# Patient Record
Sex: Female | Born: 1945 | Race: White | Hispanic: No | Marital: Married | Smoking: Never smoker
Health system: Southern US, Community
[De-identification: ages and names within clinical notes are randomized; demographics above are authoritative.]

## PROBLEM LIST (undated history)

## (undated) DIAGNOSIS — R112 Nausea with vomiting, unspecified: Secondary | ICD-10-CM

## (undated) DIAGNOSIS — M810 Age-related osteoporosis without current pathological fracture: Secondary | ICD-10-CM

## (undated) DIAGNOSIS — I10 Essential (primary) hypertension: Secondary | ICD-10-CM

## (undated) DIAGNOSIS — Z9889 Other specified postprocedural states: Secondary | ICD-10-CM

## (undated) DIAGNOSIS — E785 Hyperlipidemia, unspecified: Secondary | ICD-10-CM

## (undated) HISTORY — PX: BREAST SURGERY: SHX581

## (undated) HISTORY — DX: Age-related osteoporosis without current pathological fracture: M81.0

## (undated) HISTORY — DX: Essential (primary) hypertension: I10

## (undated) HISTORY — PX: BREAST CYST ASPIRATION: SHX578

## (undated) HISTORY — DX: Hyperlipidemia, unspecified: E78.5

---

## 2006-06-16 HISTORY — PX: COSMETIC SURGERY: SHX468

## 2006-08-24 ENCOUNTER — Ambulatory Visit (HOSPITAL_COMMUNITY): Admission: RE | Admit: 2006-08-24 | Discharge: 2006-08-24 | Payer: Self-pay | Admitting: Urology

## 2009-11-30 ENCOUNTER — Encounter (INDEPENDENT_AMBULATORY_CARE_PROVIDER_SITE_OTHER): Payer: Self-pay | Admitting: *Deleted

## 2010-01-07 ENCOUNTER — Encounter (INDEPENDENT_AMBULATORY_CARE_PROVIDER_SITE_OTHER): Payer: Self-pay | Admitting: *Deleted

## 2010-01-12 ENCOUNTER — Ambulatory Visit: Payer: Self-pay | Admitting: Gastroenterology

## 2010-05-18 NOTE — Letter (Signed)
Summary: Pre Visit Letter Revised  [facility]  [address] [place], [place] [address]   Phone: [phone]  Fax: [phone]    11/30/2009 MRN: [id number]  Sarah Williams [address] [place], [place]  [address]              Procedure Date:  01-26-10    Welcome to the Gastroenterology Division at [facility].    You are scheduled to see a nurse for your pre-procedure visit on 01-12-10 at 9:00a.m. on the 3rd floor at [facility], [address]  We ask that you try to arrive at our office 15 minutes prior to your appointment time to allow for check-in.  Please take a minute to review the attached form.  If you answer "Yes" to one or more of the questions on the first page, we ask that you call the person listed at your earliest opportunity.  If you answer "No" to all of the questions, please complete the rest of the form and bring it to your appointment.    Your nurse visit will consist of discussing your medical and surgical history, your immediate family medical history, and your medications.    If you are unable to list all of your medications on the form, please bring the medication bottles to your appointment and we will list them.  We will need to be aware of both prescribed and over the counter drugs.  We will need to know exact dosage information as well.    Please be prepared to read and sign documents such as consent forms, a financial agreement, and acknowledgement forms.  If necessary, and with your consent, a friend or relative is welcome to sit-in on the nurse visit with you.  Please bring your insurance card so that we may make a copy of it.  If your insurance requires a referral to see a specialist, please bring your referral form from your primary care physician.  No co-pay is required for this nurse visit.     If you cannot keep your appointment, please call [phone] to cancel or reschedule prior to your appointment date.  This allows [place]  the opportunity to schedule an appointment for another patient in need of care.   Thank you for choosing [facility] for your medical needs.  We appreciate the opportunity to care for you.  Please visit [place] at our website  to learn more about our practice.                     Sincerely,  The Gastroenterology Division

## 2010-05-18 NOTE — Letter (Signed)
Summary: Moviprep Instructions  [facility]  [address]   [place], [place] [address]   Phone: [phone]  Fax: [phone]       Sarah Williams    [birth year]    MRN: [id number]        Procedure Day Dorna Bloom: Wednesday, 01-26-10     Arrival Time: 7:30 a.m.      Procedure Time: 8:30 a.m.     Location of Procedure:                     x  [facility] (4th Floor)                        PREPARATION FOR COLONOSCOPY WITH MOVIPREP   Starting 5 days prior to your procedure 01-21-10  do not eat nuts, seeds, popcorn, corn, beans, peas,  salads, or any raw vegetables.  Do not take any fiber supplements (e.g. Metamucil, Citrucel, and Benefiber).  THE DAY BEFORE YOUR PROCEDURE         DATE: 01-25-10  DAY: Tuesday  1.  Drink clear liquids the entire day-NO SOLID FOOD  2.  Do not drink anything colored red or purple.  Avoid juices with pulp.  No orange juice.  3.  Drink at least 64 oz. (8 glasses) of fluid/clear liquids during the day to prevent dehydration and help the prep work efficiently.  CLEAR LIQUIDS INCLUDE: Water Jello Ice Popsicles Tea (sugar ok, no milk/cream) Powdered fruit flavored drinks Coffee (sugar ok, no milk/cream) Gatorade Juice: apple, white grape, white cranberry  Lemonade Clear bullion, consomm, broth Carbonated beverages (any kind) Strained chicken noodle soup Hard Candy                             4.  In the morning, mix first dose of MoviPrep solution:    Empty 1 Pouch A and 1 Pouch B into the disposable container    Add lukewarm drinking water to the top line of the container. Mix to dissolve    Refrigerate (mixed solution should be used within 24 hrs)  5.  Begin drinking the prep at 5:00 p.m. The MoviPrep container is divided by 4 marks.   Every 15 minutes drink the solution down to the next mark (approximately 8 oz) until the full liter is complete.   6.  Follow completed prep with 16 oz of clear liquid of your  choice (Nothing red or purple).  Continue to drink clear liquids until bedtime.  7.  Before going to bed, mix second dose of MoviPrep solution:    Empty 1 Pouch A and 1 Pouch B into the disposable container    Add lukewarm drinking water to the top line of the container. Mix to dissolve    Refrigerate  THE DAY OF YOUR PROCEDURE      DATE: 01-26-10  DAY: Wednesday  Beginning at  3:30 a.m. (5 hours before procedure):         1. Every 15 minutes, drink the solution down to the next mark (approx 8 oz) until the full liter is complete.  2. Follow completed prep with 16 oz. of clear liquid of your choice.    3. You may drink clear liquids until  6:30 a.m. (2 HOURS BEFORE PROCEDURE).   MEDICATION INSTRUCTIONS  Unless otherwise instructed, you should take regular prescription medications with a small sip of water   as early  as possible the morning of your procedure.           OTHER INSTRUCTIONS  You will need a responsible adult at least 63 years of age to accompany you and drive you home.   This person must remain in the waiting room during your procedure.  Wear loose fitting clothing that is easily removed.  Leave jewelry and other valuables at home.  However, you may wish to bring a book to read or  an iPod/MP3 player to listen to music as you wait for your procedure to start.  Remove all body piercing jewelry and leave at home.  Total time from sign-in until discharge is approximately 2-3 hours.  You should go home directly after your procedure and rest.  You can resume normal activities the  day after your procedure.  The day of your procedure you should not:   Drive   Make legal decisions   Operate machinery   Drink alcohol   Return to work  You will receive specific instructions about eating, activities and medications before you leave.    The above instructions have been reviewed and explained to me by  Karl Bales RN  January 12, 2010 9:12  AM    I fully understand and can verbalize these instructions _____________________________ Date _________

## 2010-05-18 NOTE — Miscellaneous (Signed)
Summary: [facility] previsit  Clinical Lists Changes  Medications: Added new medication of MOVIPREP 100 GM  SOLR (PEG-KCL-NACL-NASULF-NA ASC-C) As per prep instructions. - Signed Rx of MOVIPREP 100 GM  SOLR (PEG-KCL-NACL-NASULF-NA ASC-C) As per prep instructions.;  #1 x 0;  Signed;  Entered by: Karl Bales RN;  Authorized by: Rachael Fee MD;  Method used: Electronically to [facility] [address], [place], [place], [place]  [address], Ph: [phone], Fax: [phone] Observations: Added new observation of NKA: T (01/12/2010 8:51)    Prescriptions: MOVIPREP 100 GM  SOLR (PEG-KCL-NACL-NASULF-NA ASC-C) As per prep instructions.  #1 x 0   Entered by:   Karl Bales RN   Authorized by:   Rachael Fee MD   Signed by:   Karl Bales RN on 01/12/2010   Method used:   Electronically to        [facility]  [address] (retail)       [address]       [place], [place]  [address]       Ph: [phone]       Fax: [phone]   RxID:   [id number]

## 2013-09-12 LAB — HM MAMMOGRAPHY: HM Mammogram: ABNORMAL

## 2013-09-19 ENCOUNTER — Encounter: Payer: Self-pay | Admitting: Family Medicine

## 2013-09-30 LAB — HM MAMMOGRAPHY: HM Mammogram: NEGATIVE

## 2013-10-07 ENCOUNTER — Encounter: Payer: Self-pay | Admitting: Family Medicine

## 2014-02-05 ENCOUNTER — Encounter: Payer: Self-pay | Admitting: Family Medicine

## 2014-02-05 DIAGNOSIS — I10 Essential (primary) hypertension: Secondary | ICD-10-CM | POA: Insufficient documentation

## 2014-02-05 DIAGNOSIS — M810 Age-related osteoporosis without current pathological fracture: Secondary | ICD-10-CM | POA: Insufficient documentation

## 2014-02-05 DIAGNOSIS — E785 Hyperlipidemia, unspecified: Secondary | ICD-10-CM | POA: Insufficient documentation

## 2014-02-18 ENCOUNTER — Encounter: Payer: Self-pay | Admitting: Physician Assistant

## 2014-02-18 ENCOUNTER — Ambulatory Visit (INDEPENDENT_AMBULATORY_CARE_PROVIDER_SITE_OTHER): Payer: Medicare Other | Admitting: Physician Assistant

## 2014-02-18 VITALS — BP 200/108 | HR 68 | Temp 97.5°F | Resp 18 | Ht 63.75 in | Wt 156.0 lb

## 2014-02-18 DIAGNOSIS — Z0000 Encounter for general adult medical examination without abnormal findings: Secondary | ICD-10-CM | POA: Diagnosis not present

## 2014-02-18 DIAGNOSIS — E559 Vitamin D deficiency, unspecified: Secondary | ICD-10-CM | POA: Diagnosis not present

## 2014-02-18 DIAGNOSIS — Z23 Encounter for immunization: Secondary | ICD-10-CM | POA: Diagnosis not present

## 2014-02-18 DIAGNOSIS — E785 Hyperlipidemia, unspecified: Secondary | ICD-10-CM | POA: Diagnosis not present

## 2014-02-18 DIAGNOSIS — I10 Essential (primary) hypertension: Secondary | ICD-10-CM | POA: Diagnosis not present

## 2014-02-18 DIAGNOSIS — M8580 Other specified disorders of bone density and structure, unspecified site: Secondary | ICD-10-CM | POA: Insufficient documentation

## 2014-02-18 LAB — CBC WITH DIFFERENTIAL/PLATELET
BASOS ABS: 0.1 10*3/uL (ref 0.0–0.1)
Basophils Relative: 1 % (ref 0–1)
EOS ABS: 0.1 10*3/uL (ref 0.0–0.7)
EOS PCT: 1 % (ref 0–5)
HEMATOCRIT: 42.7 % (ref 36.0–46.0)
Hemoglobin: 14.9 g/dL (ref 12.0–15.0)
LYMPHS ABS: 1.7 10*3/uL (ref 0.7–4.0)
Lymphocytes Relative: 27 % (ref 12–46)
MCH: 29.2 pg (ref 26.0–34.0)
MCHC: 34.9 g/dL (ref 30.0–36.0)
MCV: 83.7 fL (ref 78.0–100.0)
MONO ABS: 0.5 10*3/uL (ref 0.1–1.0)
Monocytes Relative: 8 % (ref 3–12)
Neutro Abs: 4 10*3/uL (ref 1.7–7.7)
Neutrophils Relative %: 63 % (ref 43–77)
PLATELETS: 229 10*3/uL (ref 150–400)
RBC: 5.1 MIL/uL (ref 3.87–5.11)
RDW: 13.4 % (ref 11.5–15.5)
WBC: 6.4 10*3/uL (ref 4.0–10.5)

## 2014-02-18 LAB — COMPLETE METABOLIC PANEL WITH GFR
ALT: 11 U/L (ref 0–35)
AST: 17 U/L (ref 0–37)
Albumin: 4.1 g/dL (ref 3.5–5.2)
Alkaline Phosphatase: 80 U/L (ref 39–117)
BILIRUBIN TOTAL: 0.4 mg/dL (ref 0.2–1.2)
BUN: 16 mg/dL (ref 6–23)
CALCIUM: 8.9 mg/dL (ref 8.4–10.5)
CHLORIDE: 105 meq/L (ref 96–112)
CO2: 26 meq/L (ref 19–32)
CREATININE: 0.72 mg/dL (ref 0.50–1.10)
GFR, Est Non African American: 88 mL/min
Glucose, Bld: 92 mg/dL (ref 70–99)
Potassium: 4.1 mEq/L (ref 3.5–5.3)
Sodium: 142 mEq/L (ref 135–145)
Total Protein: 6.6 g/dL (ref 6.0–8.3)

## 2014-02-18 LAB — LIPID PANEL
CHOLESTEROL: 202 mg/dL — AB (ref 0–200)
HDL: 68 mg/dL (ref >39)
LDL CALC: 113 mg/dL — AB (ref 0–99)
TRIGLYCERIDES: 103 mg/dL (ref <150)
Total CHOL/HDL Ratio: 3 Ratio
VLDL: 21 mg/dL (ref 0–40)

## 2014-02-18 LAB — TSH: TSH: 1.107 u[IU]/mL (ref 0.350–4.500)

## 2014-02-18 MED ORDER — AMLODIPINE BESYLATE 5 MG PO TABS: 5.0000 mg | ORAL_TABLET | Freq: Every day | ORAL | Status: DC

## 2014-02-18 MED ORDER — BENAZEPRIL HCL 10 MG PO TABS: 10.0000 mg | ORAL_TABLET | Freq: Every day | ORAL | Status: DC

## 2014-02-18 NOTE — Progress Notes (Signed)
Patient ID: Sarah Williams MRN: [id number], DOB: 04/23/1945, 68 y.o. Date of Encounter: 02/18/2014,   Chief Complaint: Physical (CPE)  HPI: 68 y.o. y/o white female  here for CPE and also as a "New Patient".   Actually, she had been a patient here in the past but says that she then lost her insurance and had no insurance for a couple of years and that is why she has not been here recently.  Last office visit here was 12/15/2010. Last complete physical exam here was with me on 11/29/2009.  She says that 2 years ago her husband lost his job and they lost insurance at that time. Therefore she has had no medical care and has not been on any prescription medications during that time. Luckily, she says that she had no illnesses or medical problems during that period of time. Therefore, no new medical information to update compared to what was already in her paper chart from the past.  Unfortunately, because she had no insurance coverage, she has been off of her Norvasc 10 mg Ambien as 20 mg which he been prescribed prior to the insurance loss. Therefore today we are getting her blood pressure readings significantly elevated. Even with this, she still is reporting no angina symptoms and no TIA/CVA type symptoms.   Review of Systems: Consitutional: No fever, chills, fatigue, night sweats, lymphadenopathy. No significant/unexplained weight changes. Eyes: No visual changes, eye redness, or discharge. ENT/Mouth: No ear pain, sore throat, nasal drainage, or sinus pain. Cardiovascular: No chest pressure,heaviness, tightness or squeezing, even with exertion. No increased shortness of breath or dyspnea on exertion.No palpitations, edema, orthopnea, PND. Respiratory: No cough, hemoptysis, SOB, or wheezing. Gastrointestinal: No anorexia, dysphagia, reflux, pain, nausea, vomiting, hematemesis, diarrhea, constipation, BRBPR, or melena. Breast: No mass, nodules, bulging, or retraction. No skin changes or  inflammation. No nipple discharge. No lymphadenopathy. Genitourinary: No dysuria, hematuria, incontinence, vaginal discharge, pruritis, burning, abnormal bleeding, or pain. Musculoskeletal: No decreased ROM, No joint pain or swelling. No significant pain in neck, back, or extremities. Skin: No rash, pruritis, or concerning lesions. Neurological: No headache, dizziness, syncope, seizures, tremors, memory loss, coordination problems, or paresthesias. Psychological: No anxiety, depression, hallucinations, SI/HI. Endocrine: No polydipsia, polyphagia, polyuria, or known diabetes.No increased fatigue. No palpitations/rapid heart rate. No significant/unexplained weight change. All other systems were reviewed and are otherwise negative.  Past Medical History  Diagnosis Date  . Hypertension   . Osteoporosis   . Hyperlipidemia     11/2010     Past Surgical History  Procedure Laterality Date  . Cosmetic surgery  03/08    blepharoplasty  . Breast surgery      Benign--Bilateral Lumpectomies--Benign    Home Meds:  Outpatient Prescriptions Prior to Visit  Medication Sig Dispense Refill  . Cholecalciferol (VITAMIN D) 2000 UNITS tablet Take 2,000 Units by mouth every evening.    . Multiple Vitamin (MULTIVITAMIN) tablet Take 1 tablet by mouth every evening. Centrum Silver     No facility-administered medications prior to visit.    Allergies: No Known Allergies  History   Social History  . Marital Status: Married    Spouse Name: N/A    Number of Children: N/A  . Years of Education: N/A   Occupational History  . Not on file.   Social History Main Topics  . Smoking status: Never Smoker   . Smokeless tobacco: Never Used  . Alcohol Use: 3.0 oz/week    5 Glasses of wine per week  .  Drug Use: No  . Sexual Activity: Yes    Birth Control/ Protection: Post-menopausal   Other Topics Concern  . Not on file   Social History Narrative   Entered 02/2014:   Married.   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   Works about 16 hours a week now.   Says she "has to stay busy"--in summer works in yard, garden.    Always working on a project in the house etc.     Family History  Problem Relation Age of Onset  . Hypertension Father   . Kidney disease Father   . Stroke Father   . Heart disease Mother 50    CAD  . Heart disease Paternal Grandfather 30    CAD, MI    Physical Exam: Blood pressure 200/108, pulse 68, temperature 97.5 F (36.4 C), temperature source Oral, resp. rate 18, height 5' 3.75" (1.619 m), weight 156 lb (70.761 kg)., Body mass index is 27 kg/(m^2). General: Well developed, well nourished, WF. Appears in no acute distress. HEENT: Normocephalic, atraumatic. Conjunctiva pink, sclera non-icteric. Pupils 2 mm constricting to 1 mm, round, regular, and equally reactive to light and accomodation. EOMI. Internal auditory canal clear. TMs with good cone of light and without pathology. Nasal mucosa pink. Nares are without discharge. No sinus tenderness. Oral mucosa pink.  Pharynx without exudate.   Neck: Supple. Trachea midline. No thyromegaly. Full ROM. No lymphadenopathy.No Carotid Bruits. Lungs: Clear to auscultation bilaterally without wheezes, rales, or rhonchi. Breathing is of normal effort and unlabored. Cardiovascular: RRR with S1 S2. No murmurs, rubs, or gallops. Distal pulses 2+ symmetrically. No carotid or abdominal bruits. Breast: Symmetrical. No masses. Nipples without discharge. Abdomen: Soft, non-tender, non-distended with normoactive bowel sounds. No hepatosplenomegaly or masses. No rebound/guarding. No CVA tenderness. No hernias.  Genitourinary:  External genitalia without lesions. Vaginal mucosa pink.No discharge present. Cervix pink and without discharge. No cervical tenderness.Normal uterus size. No adnexal mass or tenderness.  Pap smear  taken. Musculoskeletal: Full range of motion and 5/5 strength throughout. Without swelling, atrophy, tenderness, crepitus, or warmth. Extremities without clubbing, cyanosis, or edema Skin: Warm and moist without erythema, ecchymosis, wounds, or rash. Neuro: A+Ox3. CN II-XII grossly intact. Moves all extremities spontaneously. Full sensation throughout. Normal gait. DTR 2+ throughout upper and lower extremities. Finger to nose intact. Psych:  Responds to questions appropriately with a normal affect.   Assessment/Plan:  68 y.o. y/o female here for CPE 1. Visit for preventive health examination   A. Screening Labs: She IS fasting.  - CBC with Differential - COMPLETE METABOLIC PANEL WITH GFR - Lipid panel - TSH - Vit D  25 hydroxy (rtn osteoporosis monitoring)  B. Pap: - PAP, Thin Prep w/HPV rflx HPV Type 16/18   C. Screening Mammogram: Prior to loss of insurance, she had mammograms performed routinely at [facility]. She says that she did go have another mammogram performed at [facility] in either June or July of this year (2015)-- (once she had insurance again) states that this was negative.  D. DEXA/BMD:  Has had no DEXA in > 2 years.  Scars to with her that we start getting these done at [facility] versus more head so that the results would be in epic and she is agreeable with this. Therefore I have scheduled placed and notes to schedule this at [facility].  - DG Bone Density; Future  E.  Colorectal Cancer Screening: - Ambulatory referral to Gastroenterology  At the complete physical exam with me 11/29/2009--I had documented that we discussed colonoscopy and she at that time stated she had never had one but was agreeable for me to schedule. Today she states that we did schedule her an appointment but then she found out that with the insurance she had at that time that she was going to have to pay $5000 out of pocket. Therefore she never had colonoscopy done.  She now has Medicare/new  insurance coverage. i will place a new order-- she prefers to go to a place towards [place] so I am scheduling this with Dr. Genia Deloark's group. Her for a location is good for her and the results will be in epic.  F. Immunizations:  Influenza: she has not had an influenza vaccine yet this season and is agreeable to receive this today. Tetanus: She received Tdap here 06/18/2007 Pneumococcal: he has had no Pneumonia vaccines so far. His cussed guidelines and she is agreeable to proceed. Will give Prevnar 13 today. 6-12 months later she will receive the Pneumovax 23. Zostavax:  Today I wrote on her AVS--she is to call her insurance and find out their coverage of this vaccine and what her cost would be out of pocket. If she is agreeable to pay this cost, then she will call us and we will send an order to her pharmacy and she will go there to receive the vaccine.  2. Essential hypertension See HPI regarding history of this. In past she was on Norvasc 10 mg and benazepril 20 mg. I will have her titrate back up to these doses. For the amlodipine she is going to take one 5 mg tablet for one week then go up to taking 2 of these daily. For the benazepril she will take one of these for 1 week and go up to taking 2 of these daily.  She will schedule follow-up office visit here in 3 weeks for me to recheck blood pressure and BMET on medications.  - amLODipine (NORVASC) 5 MG tablet; Take 1 tablet (5 mg total) by mouth daily.  Dispense: 30 tablet; Refill: 1 - benazepril (LOTENSIN) 10 MG tablet; Take 1 tablet (10 mg total) by mouth daily.  Dispense: 30 tablet; Refill: 1 - COMPLETE METABOLIC PANEL WITH GFR  3. Hyperlipidemia - COMPLETE METABOLIC PANEL WITH GFR - Lipid panel  4. Osteopenia - DG Bone Density; Future - Vit D  25 hydroxy (rtn osteoporosis monitoring)  5. Vitamin D deficiency I verified with patient and she says she is taking the vitamin D at the dose listed on her medicine list. - Vit D  25  hydroxy (rtn osteoporosis monitoring)   Follow-up office visit in 3 weeks to recheck blood pressure and BMET.   Signed, 96 Beach AvenueMary Beth SidneyDixon, GeorgiaPA, [facility] 02/18/2014 9:02 AM

## 2014-02-19 ENCOUNTER — Other Ambulatory Visit: Payer: Self-pay | Admitting: Physician Assistant

## 2014-02-19 DIAGNOSIS — Z1211 Encounter for screening for malignant neoplasm of colon: Secondary | ICD-10-CM

## 2014-02-19 LAB — VITAMIN D 25 HYDROXY (VIT D DEFICIENCY, FRACTURES): Vit D, 25-Hydroxy: 44 ng/mL (ref 30–89)

## 2014-02-20 LAB — PAP, THIN PREP W/HPV RFLX HPV TYPE 16/18: HPV DNA HIGH RISK: NOT DETECTED

## 2014-02-23 ENCOUNTER — Encounter: Payer: Self-pay | Admitting: Family Medicine

## 2014-03-04 ENCOUNTER — Telehealth: Payer: Self-pay

## 2014-03-04 NOTE — Telephone Encounter (Signed)
PT is calling to set up TCS

## 2014-03-05 ENCOUNTER — Ambulatory Visit (HOSPITAL_COMMUNITY)
Admission: RE | Admit: 2014-03-05 | Discharge: 2014-03-05 | Disposition: A | Discharge status: Home / Self Care | Payer: Medicare Other | Source: Ambulatory Visit | Attending: Physician Assistant | Admitting: Physician Assistant

## 2014-03-05 ENCOUNTER — Other Ambulatory Visit: Payer: Self-pay

## 2014-03-05 DIAGNOSIS — M8580 Other specified disorders of bone density and structure, unspecified site: Secondary | ICD-10-CM | POA: Diagnosis present

## 2014-03-05 DIAGNOSIS — Z0000 Encounter for general adult medical examination without abnormal findings: Secondary | ICD-10-CM

## 2014-03-05 DIAGNOSIS — Z1211 Encounter for screening for malignant neoplasm of colon: Secondary | ICD-10-CM

## 2014-03-05 NOTE — Telephone Encounter (Signed)
Gastroenterology Pre-Procedure Review  Request Date: 03/05/2014 Requesting Physician:Allayne ButcherMary Dixon   PATIENT REVIEW QUESTIONS: The patient responded to the following health history questions as indicated:    Pt does drink a glass of wine most evenings  1. Diabetes Melitis: no 2. Joint replacements in the past 12 months: no 3. Major health problems in the past 3 months: no 4. Has an artificial valve or MVP: no 5. Has a defibrillator: no 6. Has been advised in past to take antibiotics in advance of a procedure like teeth cleaning: no    MEDICATIONS & ALLERGIES:    Patient reports the following regarding taking any blood thinners:   Plavix? no Aspirin? no Coumadin? no  Patient confirms/reports the following medications:  Current Outpatient Prescriptions  Medication Sig Dispense Refill  . amLODipine (NORVASC) 5 MG tablet Take 1 tablet (5 mg total) by mouth daily. 30 tablet 1  . benazepril (LOTENSIN) 10 MG tablet Take 1 tablet (10 mg total) by mouth daily. 30 tablet 1  . Cholecalciferol (VITAMIN D) 2000 UNITS tablet Take 2,000 Units by mouth every evening.    . Multiple Vitamin (MULTIVITAMIN) tablet Take 1 tablet by mouth every evening. Centrum Silver     No current facility-administered medications for this visit.    Patient confirms/reports the following allergies:  No Known Allergies  No orders of the defined types were placed in this encounter.    AUTHORIZATION INFORMATION Primary Insurance:   ID #:  Group #:  Pre-Cert / Auth required:  Pre-Cert / Auth #:   Secondary Insurance:   ID #:   Group #:  Pre-Cert / Auth required:  Pre-Cert / Auth #:   SCHEDULE INFORMATION: Procedure has been scheduled as follows:  Date:  03/16/2014                 Time:  2:15 PM Location: [facility] Short Stay  This Gastroenterology Pre-Precedure Review Form is being routed to the following provider(s): R. Roetta SessionsMichael Rourk, MD

## 2014-03-06 NOTE — Telephone Encounter (Signed)
Ok to schedule.

## 2014-03-09 ENCOUNTER — Telehealth: Payer: Self-pay

## 2014-03-09 MED ORDER — PEG-KCL-NACL-NASULF-NA ASC-C 100 G PO SOLR: 1.0000 | ORAL | Status: DC

## 2014-03-09 NOTE — Telephone Encounter (Signed)
I called [employer] @ [phone] and spoke to [place]atonka[place]ron R who said a PA is not required for a screening colonoscopy.

## 2014-03-09 NOTE — Telephone Encounter (Signed)
Rx sent to the pharmacy and instructions mailed to pt.  

## 2014-03-09 NOTE — Telephone Encounter (Signed)
Pt requested to pick up the instructions and they are at front for her to pick up and she is aware.

## 2014-03-11 ENCOUNTER — Other Ambulatory Visit: Payer: Self-pay

## 2014-03-11 DIAGNOSIS — Z1211 Encounter for screening for malignant neoplasm of colon: Secondary | ICD-10-CM

## 2014-03-11 MED ORDER — SODIUM CHLORIDE 0.9 % IV SOLN: INTRAVENOUS | Status: DC

## 2014-03-16 ENCOUNTER — Ambulatory Visit: Payer: Medicare Other | Admitting: Physician Assistant

## 2014-03-16 ENCOUNTER — Encounter (HOSPITAL_COMMUNITY)
Admission: RE | Disposition: A | Discharge status: Home / Self Care | Payer: Self-pay | Source: Ambulatory Visit | Attending: Internal Medicine

## 2014-03-16 ENCOUNTER — Ambulatory Visit (HOSPITAL_COMMUNITY)
Admission: RE | Admit: 2014-03-16 | Discharge: 2014-03-16 | Disposition: A | Discharge status: Home / Self Care | Payer: Medicare Other | Source: Ambulatory Visit | Attending: Internal Medicine | Admitting: Internal Medicine

## 2014-03-16 ENCOUNTER — Encounter (HOSPITAL_COMMUNITY): Payer: Self-pay

## 2014-03-16 DIAGNOSIS — Z1211 Encounter for screening for malignant neoplasm of colon: Secondary | ICD-10-CM | POA: Diagnosis not present

## 2014-03-16 DIAGNOSIS — K5730 Diverticulosis of large intestine without perforation or abscess without bleeding: Secondary | ICD-10-CM | POA: Diagnosis not present

## 2014-03-16 DIAGNOSIS — I10 Essential (primary) hypertension: Secondary | ICD-10-CM | POA: Diagnosis not present

## 2014-03-16 DIAGNOSIS — E785 Hyperlipidemia, unspecified: Secondary | ICD-10-CM | POA: Insufficient documentation

## 2014-03-16 DIAGNOSIS — Z79899 Other long term (current) drug therapy: Secondary | ICD-10-CM | POA: Insufficient documentation

## 2014-03-16 HISTORY — DX: Other specified postprocedural states: Z98.890

## 2014-03-16 HISTORY — DX: Nausea with vomiting, unspecified: R11.2

## 2014-03-16 HISTORY — PX: COLONOSCOPY: SHX5424

## 2014-03-16 SURGERY — COLONOSCOPY
Anesthesia: Moderate Sedation

## 2014-03-16 MED ORDER — MEPERIDINE HCL 100 MG/ML IJ SOLN
INTRAMUSCULAR | Status: DC | PRN
  Administered 2014-03-16: 50 mg via INTRAVENOUS
  Administered 2014-03-16 (×2): 25 mg via INTRAVENOUS

## 2014-03-16 MED ORDER — MIDAZOLAM HCL 5 MG/5ML IJ SOLN
INTRAMUSCULAR | Status: AC
  Filled 2014-03-16: qty 10

## 2014-03-16 MED ORDER — MIDAZOLAM HCL 5 MG/5ML IJ SOLN
INTRAMUSCULAR | Status: DC | PRN
  Administered 2014-03-16: 2 mg via INTRAVENOUS
  Administered 2014-03-16: 1 mg via INTRAVENOUS
  Administered 2014-03-16: 2 mg via INTRAVENOUS

## 2014-03-16 MED ORDER — SODIUM CHLORIDE 0.9 % IV SOLN
INTRAVENOUS | Status: DC
  Administered 2014-03-16: 14:00:00 via INTRAVENOUS

## 2014-03-16 MED ORDER — ONDANSETRON HCL 4 MG/2ML IJ SOLN
INTRAMUSCULAR | Status: DC | PRN
  Administered 2014-03-16: 4 mg via INTRAVENOUS

## 2014-03-16 MED ORDER — STERILE WATER FOR IRRIGATION IR SOLN
Status: DC | PRN
  Administered 2014-03-16: 15:00:00

## 2014-03-16 MED ORDER — ONDANSETRON HCL 4 MG/2ML IJ SOLN
INTRAMUSCULAR | Status: AC
  Filled 2014-03-16: qty 2

## 2014-03-16 MED ORDER — MEPERIDINE HCL 100 MG/ML IJ SOLN
INTRAMUSCULAR | Status: AC
  Filled 2014-03-16: qty 2

## 2014-03-16 NOTE — H&P (Signed)
@LOGO @   Primary Care Physician:  Karis Juba, PA-C Primary Gastroenterologist:  Dr. Gala Romney  Pre-Procedure History & Physical: HPI:  Sarah Williams is a 68 y.o. female is here for a screening colonoscopy. No prior colonoscopy. No family history of colon cancer. No GI symptoms.  Past Medical History  Diagnosis Date  . Hypertension   . Osteoporosis   . Hyperlipidemia     11/2010  . PONV (postoperative nausea and vomiting)     Past Surgical History  Procedure Laterality Date  . Cosmetic surgery  03/08    blepharoplasty  . Breast surgery      Benign--Bilateral Lumpectomies--Benign    Prior to Admission medications   Medication Sig Start Date End Date Taking? Authorizing Provider  amLODipine (NORVASC) 5 MG tablet Take 1 tablet (5 mg total) by mouth daily. 02/18/14  Yes Mary B Dixon, PA-C  benazepril (LOTENSIN) 10 MG tablet Take 1 tablet (10 mg total) by mouth daily. 02/18/14  Yes Orlena Sheldon, PA-C  Cholecalciferol (VITAMIN D) 2000 UNITS tablet Take 2,000 Units by mouth every evening.   Yes Historical Provider, MD  Multiple Vitamin (MULTIVITAMIN) tablet Take 1 tablet by mouth every evening. Centrum Silver   Yes Historical Provider, MD  peg 3350 powder (MOVIPREP) 100 G SOLR Take 1 kit (200 g total) by mouth as directed. 03/09/14  Yes Daneil Dolin, MD    Allergies as of 03/05/2014  . (No Known Allergies)    Family History  Problem Relation Age of Onset  . Hypertension Father   . Kidney disease Father   . Stroke Father   . Heart disease Mother 73    CAD  . Heart disease Paternal Grandfather 98    CAD, MI    History   Social History  . Marital Status: Married    Spouse Name: N/A    Number of Children: N/A  . Years of Education: N/A   Occupational History  . Not on file.   Social History Main Topics  . Smoking status: Never Smoker   . Smokeless tobacco: Never Used  . Alcohol Use: 3.0 oz/week    5 Glasses of wine per week  . Drug Use: No  . Sexual  Activity: Yes    Birth Control/ Protection: Post-menopausal   Other Topics Concern  . Not on file   Social History Narrative   Entered 02/2014:   Married.   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   Works about 16 hours a week now.   Says she "has to stay busy"--in summer works in yard, garden.    Always working on a project in the house etc.     Review of Systems: See HPI, otherwise negative ROS  Physical Exam: BP 156/75 mmHg  Temp(Src) 97.7 F (36.5 C) (Oral)  Resp 16  Ht 5' 4"  (1.626 m)  Wt 152 lb (68.947 kg)  BMI 26.08 kg/m2  SpO2 98% General:   Alert,  Well-developed, well-nourished, pleasant and cooperative in NAD Head:  Normocephalic and atraumatic. Eyes:  Sclera clear, no icterus.   Conjunctiva pink. Ears:  Normal auditory acuity. Nose:  No deformity, discharge,  or lesions. Mouth:  No deformity or lesions, dentition normal. Neck:  Supple; no masses or thyromegaly. Lungs:  Clear throughout to auscultation.   No wheezes, crackles, or rhonchi. No acute distress. Heart:  Regular rate and rhythm; no murmurs, clicks,  rubs,  or gallops. Abdomen:  Soft, nontender and nondistended. No masses, hepatosplenomegaly or hernias noted. Normal bowel sounds, without guarding, and without rebound.   Msk:  Symmetrical without gross deformities. Normal posture. Pulses:  Normal pulses noted. Extremities:  Without clubbing or edema. Neurologic:  Alert and  oriented x4;  grossly normal neurologically. Skin:  Intact without significant lesions or rashes. Cervical Nodes:  No significant cervical adenopathy. Psych:  Alert and cooperative. Normal mood and affect.  Impression/Plan: Sarah Williams is now here to undergo a screening colonoscopy.  First ever every her screening examination.  Risks, benefits, limitations, imponderables and alternatives regarding colonoscopy have been  reviewed with the patient. Questions have been answered. All parties agreeable.     Notice:  This dictation was prepared with Dragon dictation along with smaller phrase technology. Any transcriptional errors that result from this process are unintentional and may not be corrected upon review.

## 2014-03-16 NOTE — Discharge Instructions (Addendum)
°Colonoscopy °Discharge Instructions ° °Read the instructions outlined below and refer to this sheet in the next few weeks. These discharge instructions provide you with general information on caring for yourself after you leave the hospital. Your doctor may also give you specific instructions. While your treatment has been planned according to the most current medical practices available, unavoidable complications occasionally occur. If you have any problems or questions after discharge, call Dr. Rourk at [phone]. °ACTIVITY °· You may resume your regular activity, but move at a slower pace for the next 24 hours.  °· Take frequent rest periods for the next 24 hours.  °· Walking will help get rid of the air and reduce the bloated feeling in your belly (abdomen).  °· No driving for 24 hours (because of the medicine (anesthesia) used during the test).   °· Do not sign any important legal documents or operate any machinery for 24 hours (because of the anesthesia used during the test).  °NUTRITION °· Drink plenty of fluids.  °· You may resume your normal diet as instructed by your doctor.  °· Begin with a light meal and progress to your normal diet. Heavy or fried foods are harder to digest and may make you feel sick to your stomach (nauseated).  °· Avoid alcoholic beverages for 24 hours or as instructed.  °MEDICATIONS °· You may resume your normal medications unless your doctor tells you otherwise.  °WHAT YOU CAN EXPECT TODAY °· Some feelings of bloating in the abdomen.  °· Passage of more gas than usual.  °· Spotting of blood in your stool or on the toilet paper.  °IF YOU HAD POLYPS REMOVED DURING THE COLONOSCOPY: °· No aspirin products for 7 days or as instructed.  °· No alcohol for 7 days or as instructed.  °· Eat a soft diet for the next 24 hours.  °FINDING OUT THE RESULTS OF YOUR TEST °Not all test results are available during your visit. If your test results are not back during the visit, make an appointment  with your caregiver to find out the results. Do not assume everything is normal if you have not heard from your caregiver or the medical facility. It is important for you to follow up on all of your test results.  °SEEK IMMEDIATE MEDICAL ATTENTION IF: °· You have more than a spotting of blood in your stool.  °· Your belly is swollen (abdominal distention).  °· You are nauseated or vomiting.  °· You have a temperature over 101.  °· You have abdominal pain or discomfort that is severe or gets worse throughout the day.  ° ° °Diverticulosis information provided ° °Repeat screening colonoscopy in 10 years ° °Diverticulosis °Diverticulosis is the condition that develops when small pouches (diverticula) form in the wall of your colon. Your colon, or large intestine, is where water is absorbed and stool is formed. The pouches form when the inside layer of your colon pushes through weak spots in the outer layers of your colon. °CAUSES  °No one knows exactly what causes diverticulosis. °RISK FACTORS °· Being older than 50. Your risk for this condition increases with age. Diverticulosis is rare in people younger than 40 years. By age 80, almost everyone has it. °· Eating a low-fiber diet. °· Being frequently constipated. °· Being overweight. °· Not getting enough exercise. °· Smoking. °· Taking over-the-counter pain medicines, like aspirin and ibuprofen. °SYMPTOMS  °Most people with diverticulosis do not have symptoms. °DIAGNOSIS  °Because diverticulosis often has no symptoms,   health care providers often discover the condition during an exam for other colon problems. In many cases, a health care provider will diagnose diverticulosis while using a flexible scope to examine the colon (colonoscopy). °TREATMENT  °If you have never developed an infection related to diverticulosis, you may not need treatment. If you have had an infection before, treatment may include: °· Eating more fruits, vegetables, and grains. °· Taking a fiber  supplement. °· Taking a live bacteria supplement (probiotic). °· Taking medicine to relax your colon. °HOME CARE INSTRUCTIONS  °· Drink at least 6-8 glasses of water each day to prevent constipation. °· Try not to strain when you have a bowel movement. °· Keep all follow-up appointments. °If you have had an infection before:  °· Increase the fiber in your diet as directed by your health care provider or dietitian. °· Take a dietary fiber supplement if your health care provider approves. °· Only take medicines as directed by your health care provider. °SEEK MEDICAL CARE IF:  °· You have abdominal pain. °· You have bloating. °· You have cramps. °· You have not gone to the bathroom in 3 days. °SEEK IMMEDIATE MEDICAL CARE IF:  °· Your pain gets worse. °· Your bloating becomes very bad. °· You have a fever or chills, and your symptoms suddenly get worse. °· You begin vomiting. °· You have bowel movements that are bloody or black. °MAKE SURE YOU: °· Understand these instructions. °· Will watch your condition. °· Will get help right away if you are not doing well or get worse. °Document Released: 12/30/2003 Document Revised: 04/08/2013 Document Reviewed: 02/26/2013 °ExitCare® Patient Information ©2015 [employer]. This information is not intended to replace advice given to you by your health care provider. Make sure you discuss any questions you have with your health care provider. ° °

## 2014-03-16 NOTE — Op Note (Signed)
[facility] [address] [place], [id number]   COLONOSCOPY PROCEDURE REPORT  PATIENT: Sarah OhCarter, Darla H  MR#: [id number] BIRTHDATE: 1946/03/21 , 68  yrs. old GENDER: female ENDOSCOPIST: R.  Roetta SessionsMichael Rachyl Wuebker, MD FACP [facility] REFERRED AO:ZHYQBY:Mary Dionicio StallBeth Dixon, PA-C PROCEDURE DATE:  03/16/2014 PROCEDURE:   Colonoscopy, screening INDICATIONS:Average risk colorectal cancer screening examination. MEDICATIONS: Versed 5 mg IV and Demerol 100 mg IV in divided doses. Zofran 4 mg IV. ASA CLASS:       Class II  CONSENT: The risks, benefits, alternatives and imponderables including but not limited to bleeding, perforation as well as the possibility of a missed lesion have been reviewed.  The potential for biopsy, lesion removal, etc. have also been discussed. Questions have been answered.  All parties agreeable.  Please see the history and physical in the medical record for more information.  DESCRIPTION OF PROCEDURE:   After the risks benefits and alternatives of the procedure were thoroughly explained, informed consent was obtained.  The digital rectal exam revealed no abnormalities of the rectum.   The EC-[id number] ([id number]  endoscope was introduced through the anus and advanced to the terminal ileum which was intubated for a short distance. No adverse events experienced.   The quality of the prep was     The instrument was then slowly withdrawn as the colon was fully examined.      COLON FINDINGS: Normal-appearing rectum.  Rectal vault small. Unable to retroflex.  Rectal mucosa seen well on?"face.  Scattered left-sided diverticula; the remainder of the colonic mucosa appeared normal.  The distal 5 cm of terminal ileal mucosa also appeared normal.  Retroflexion was not performed. .  Withdrawal time=9 minutes 0 seconds.  The scope was withdrawn and the procedure completed. COMPLICATIONS: There were no immediate complications.  ENDOSCOPIC IMPRESSION: Colonic  diverticulosis.  RECOMMENDATIONS: Repeat screening colonoscopy in 10 years  eSigned:  R. Roetta SessionsMichael Jaycey Gens, MD Jerrel IvoryFACP [facility] 03/16/2014 3:24 PM   cc:  CPT CODES: ICD CODES:  The ICD and CPT codes recommended by this software are interpretations from the data that the clinical staff has captured with the software.  The verification of the translation of this report to the ICD and CPT codes and modifiers is the sole responsibility of the health care institution and practicing physician where this report was generated.  [employer] will not be held responsible for the validity of the ICD and CPT codes included on this report.  AMA assumes no liability for data contained or not contained herein. CPT is a Publishing rights managerregistered trademark of the [employer].

## 2014-03-17 ENCOUNTER — Encounter (HOSPITAL_COMMUNITY): Payer: Self-pay | Admitting: Internal Medicine

## 2014-03-19 ENCOUNTER — Encounter: Payer: Self-pay | Admitting: Physician Assistant

## 2014-03-19 ENCOUNTER — Ambulatory Visit (INDEPENDENT_AMBULATORY_CARE_PROVIDER_SITE_OTHER): Payer: Medicare Other | Admitting: Physician Assistant

## 2014-03-19 VITALS — BP 158/86 | HR 68 | Temp 98.3°F | Resp 18 | Wt 156.0 lb

## 2014-03-19 DIAGNOSIS — I10 Essential (primary) hypertension: Secondary | ICD-10-CM

## 2014-03-19 LAB — BASIC METABOLIC PANEL WITH GFR
BUN: 14 mg/dL (ref 6–23)
CO2: 29 mEq/L (ref 19–32)
CREATININE: 0.62 mg/dL (ref 0.50–1.10)
Calcium: 9.7 mg/dL (ref 8.4–10.5)
Chloride: 104 mEq/L (ref 96–112)
GFR, Est African American: >89 mL/min
Glucose, Bld: 100 mg/dL — ABNORMAL HIGH (ref 70–99)
POTASSIUM: 4.4 meq/L (ref 3.5–5.3)
Sodium: 142 mEq/L (ref 135–145)

## 2014-03-19 MED ORDER — BENAZEPRIL HCL 20 MG PO TABS: 20.0000 mg | ORAL_TABLET | Freq: Every day | ORAL | Status: DC

## 2014-03-19 MED ORDER — AMLODIPINE BESYLATE 10 MG PO TABS: 10.0000 mg | ORAL_TABLET | Freq: Every day | ORAL | Status: DC

## 2014-03-19 NOTE — Progress Notes (Signed)
Patient ID: Sarah Williams MRN: [id number], DOB: 06/08/1945, 68 y.o. Date of Encounter: 03/19/2014,   Chief Complaint: F/U HTN  HPI: 68 y.o. y/o white female here to f/u HTN.    She recently had office visit with me on 02/18/2014 for CPE and also as a "New Patient".  The following is copied from that [place] Note 02/18/2014:  Actually, she had been a patient here in the past but says that she then lost her insurance and had no insurance for a couple of years and that is why she has not been here recently.  Last office visit here was 12/15/2010. Last complete physical exam here was with me on 11/29/2009.  She says that 2 years ago her husband lost his job and they lost insurance at that time. Therefore she has had no medical care and has not been on any prescription medications during that time. Luckily, she says that she had no illnesses or medical problems during that period of time. Therefore, no new medical information to update compared to what was already in her paper chart from the past.  Unfortunately, because she had no insurance coverage, she has been off of her Norvasc 10 mg and benazepril 35m,  which he been prescribed prior to the insurance loss. Therefore today we are getting her blood pressure readings significantly elevated. Even with this, she still is reporting no angina symptoms and no TIA/CVA type symptoms and no chest pain or SOB.  At that visit on 02/18/14 I prescribed Norvasc 5 mg--told her to take 1 daily for 1 week then increase to 2 daily.                                      --Also prescribed benazepril 10 mg.--Told her to take 1 daily for 1 week then increase to 2 daily.   Today--03/19/2014--- she says that she has been taking these medications as directed and as listed above. She says that she bought a new blood pressure cuff about 6 months ago that was expensive so she thinks that should be accurate. Says that she has been checking her blood pressure 2 or 3  times per day. Says she is getting very different readings at different times but on average is around 128/130 over 70s. She has noticed no adverse effects from the medications.   Review of Systems: Consitutional: No fever, chills, fatigue, night sweats, lymphadenopathy. No significant/unexplained weight changes. Eyes: No visual changes, eye redness, or discharge. ENT/Mouth: No ear pain, sore throat, nasal drainage, or sinus pain. Cardiovascular: No chest pressure,heaviness, tightness or squeezing, even with exertion. No increased shortness of breath or dyspnea on exertion.No palpitations, edema, orthopnea, PND. Respiratory: No cough, hemoptysis, SOB, or wheezing. Gastrointestinal: No anorexia, dysphagia, reflux, pain, nausea, vomiting, hematemesis, diarrhea, constipation, BRBPR, or melena. Breast: No mass, nodules, bulging, or retraction. No skin changes or inflammation. No nipple discharge. No lymphadenopathy. Genitourinary: No dysuria, hematuria, incontinence, vaginal discharge, pruritis, burning, abnormal bleeding, or pain. Musculoskeletal: No decreased ROM, No joint pain or swelling. No significant pain in neck, back, or extremities. Skin: No rash, pruritis, or concerning lesions. Neurological: No headache, dizziness, syncope, seizures, tremors, memory loss, coordination problems, or paresthesias. Psychological: No anxiety, depression, hallucinations, SI/HI. Endocrine: No polydipsia, polyphagia, polyuria, or known diabetes.No increased fatigue. No palpitations/rapid heart rate. No significant/unexplained weight change. All other systems were reviewed and are otherwise negative.  Past Medical  History  Diagnosis Date  . Hypertension   . Osteoporosis   . Hyperlipidemia     11/2010  . PONV (postoperative nausea and vomiting)      Past Surgical History  Procedure Laterality Date  . Cosmetic surgery  03/08    blepharoplasty  . Breast surgery      Benign--Bilateral  Lumpectomies--Benign  . Colonoscopy N/A 03/16/2014    Procedure: COLONOSCOPY;  Surgeon: Daneil Dolin, MD;  Location: [facility];  Service: Endoscopy;  Laterality: N/A;  2:15 PM    Home Meds:  Outpatient Prescriptions Prior to Visit  Medication Sig Dispense Refill  . amLODipine (NORVASC) 5 MG tablet Take 1 tablet (5 mg total) by mouth daily. 30 tablet 1  . benazepril (LOTENSIN) 10 MG tablet Take 1 tablet (10 mg total) by mouth daily. 30 tablet 1  . Cholecalciferol (VITAMIN D) 2000 UNITS tablet Take 2,000 Units by mouth every evening.    . Multiple Vitamin (MULTIVITAMIN) tablet Take 1 tablet by mouth every evening. Centrum Silver    . peg 3350 powder (MOVIPREP) 100 G SOLR Take 1 kit (200 g total) by mouth as directed. 1 kit 0   No facility-administered medications prior to visit.    Allergies: No Known Allergies  History   Social History  . Marital Status: Married    Spouse Name: N/A    Number of Children: N/A  . Years of Education: N/A   Occupational History  . Not on file.   Social History Main Topics  . Smoking status: Never Smoker   . Smokeless tobacco: Never Used  . Alcohol Use: 3.0 oz/week    5 Glasses of wine per week  . Drug Use: No  . Sexual Activity: Yes    Birth Control/ Protection: Post-menopausal   Other Topics Concern  . Not on file   Social History Narrative   Entered 02/2014:   Married.   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   Works about 16 hours a week now.   Says she "has to stay busy"--in summer works in yard, garden.    Always working on a project in the house etc.     Family History  Problem Relation Age of Onset  . Hypertension Father   . Kidney disease Father   . Stroke Father   . Heart disease Mother 45    CAD  . Heart disease Paternal Grandfather 41    CAD, MI    Physical Exam: Blood pressure 158/86, pulse 68, temperature  98.3 F (36.8 C), temperature source Oral, resp. rate 18, weight 156 lb (70.761 kg)., Body mass index is 26.76 kg/(m^2). General: Well developed, well nourished, WF. Appears in no acute distress.  Neck: Supple. Trachea midline. No thyromegaly. Full ROM. No lymphadenopathy.No Carotid Bruits. Lungs: Clear to auscultation bilaterally without wheezes, rales, or rhonchi. Breathing is of normal effort and unlabored. Cardiovascular: RRR with S1 S2. No murmurs, rubs, or gallops.  No carotid bruits.No LE Edema.  Musculoskeletal: Full range of motion and 5/5 strength throughout. Skin: Warm and moist.  No LE Edema. Neuro: A+Ox3. CN II-XII grossly intact. Moves all extremities spontaneously. Full sensation throughout. Normal gait. Psych:  Responds to questions appropriately with a normal affect.   Assessment/Plan:  68 y.o. y/o female here for   1. Essential hypertension  Sh mentioned that she went for colonoscopy this past Monday.  I then  saw that all of her blood pressure readings are documented] easily see them today from that when she was there for the procedure.  Noted that during just the 1-1/2 hours that she was there for the procedure her blood pressure did vary quite a bit and was quite labile.  Readings during that time were as follows: 145/77,   156/76,   126/69,   138/104,    123/67,   118/70,    99/52,   158/86 As well she is getting some labile readings at home but on average it is very good.  We'll continue current dose of medications for now. She will continue to monitor at home realizing that they are labile that time will follow-up with me if she is getting consistently high or consistently low readings. - BASIC METABOLIC PANEL WITH GFR - benazepril (LOTENSIN) 20 MG tablet; Take 1 tablet (20 mg total) by mouth daily.  Dispense: 90 tablet; Refill: 3 - amLODipine (NORVASC) 10 MG tablet; Take 1 tablet (10 mg total) by mouth daily.  Dispense: 90 tablet; Refill: 3   We'll plan for her to  have routine follow-up office visit in 6 months. Follow-up sooner if she is having consistently high or consistently low blood pressure readings or she has any other medical problems arise in the meantime.   THE FOLLOWING IS COPIED FROM HER CPE ON 02/18/2014:  CPE 1. Visit for preventive health examination   A. Screening Labs: She IS fasting.  - CBC with Differential - COMPLETE METABOLIC PANEL WITH GFR - Lipid panel - TSH - Vit D  25 hydroxy (rtn osteoporosis monitoring)  B. Pap: - PAP, Thin Prep w/HPV rflx HPV Type 16/18   C. Screening Mammogram: Prior to loss of insurance, she had mammograms performed routinely at [facility]. She says that she did go have another mammogram performed at [facility] in either June or July of this year (2015)-- (once she had insurance again) states that this was negative.  D. DEXA/BMD:  Has had no DEXA in > 2 years.  Scars to with her that we start getting these done at [facility] versus more head so that the results would be in epic and she is agreeable with this. Therefore I have scheduled placed and notes to schedule this at [place]; Future  E. Colorectal Cancer Screening: - Ambulatory referral to Gastroenterology  At the complete physical exam with me 11/29/2009--I had documented that we discussed colonoscopy and she at that time stated she had never had one but was agreeable for me to schedule. Today she states that we did schedule her an appointment but then she found out that with the insurance she had at that time that she was going to have to pay $5000 out of pocket. Therefore she never had colonoscopy done.  She now has Medicare/new insurance coverage. i will place a new order-- she prefers to go to a place towards  so I am scheduling this with Dr. Guerry Minors group. Her for a location is good for her and the results will be in epic.  F. Immunizations:  Influenza: she has not had an influenza vaccine yet this  season and is agreeable to receive this today. Tetanus: She received Tdap here 06/18/2007 Pneumococcal: he has had no Pneumonia vaccines so far. His cussed guidelines and she is agreeable to proceed. Will give Prevnar 13 today. 6-12 months later she will receive the Pneumovax 23. Zostavax:  Today I wrote on her AVS--she is  to call her insurance and find out their coverage of this vaccine and what her cost would be out of pocket. If she is agreeable to pay this cost, then she will call us and we will send an order to her pharmacy and she will go there to receive the vaccine.  2. Essential hypertension See HPI regarding history of this. In past she was on Norvasc 10 mg and benazepril 20 mg. I will have her titrate back up to these doses. For the amlodipine she is going to take one 5 mg tablet for one week then go up to taking 2 of these daily. For the benazepril she will take one of these for 1 week and go up to taking 2 of these daily.  She will schedule follow-up office visit here in 3 weeks for me to recheck blood pressure and BMET on medications.  - amLODipine (NORVASC) 5 MG tablet; Take 1 tablet (5 mg total) by mouth daily.  Dispense: 30 tablet; Refill: 1 - benazepril (LOTENSIN) 10 MG tablet; Take 1 tablet (10 mg total) by mouth daily.  Dispense: 30 tablet; Refill: 1 - COMPLETE METABOLIC PANEL WITH GFR  3. Hyperlipidemia - COMPLETE METABOLIC PANEL WITH GFR - Lipid panel  4. Osteopenia - DG Bone Density; Future - Vit D  25 hydroxy (rtn osteoporosis monitoring)  5. Vitamin D deficiency I verified with patient and she says she is taking the vitamin D at the dose listed on her medicine list. - Vit D  25 hydroxy (rtn osteoporosis monitoring)   Follow-up office visit in 3 weeks to recheck blood pressure and BMET.   Signed, [address] [place], [place], [facility] 03/19/2014 9:15 AM

## 2014-03-27 ENCOUNTER — Encounter: Payer: Self-pay | Admitting: *Deleted

## 2014-04-23 ENCOUNTER — Encounter: Payer: Self-pay | Admitting: Physician Assistant

## 2014-09-24 ENCOUNTER — Ambulatory Visit: Payer: Medicare Other | Admitting: Physician Assistant

## 2014-10-07 ENCOUNTER — Telehealth: Payer: Self-pay | Admitting: Family Medicine

## 2014-10-07 ENCOUNTER — Encounter: Payer: Self-pay | Admitting: Physician Assistant

## 2014-10-07 ENCOUNTER — Ambulatory Visit (INDEPENDENT_AMBULATORY_CARE_PROVIDER_SITE_OTHER): Payer: Medicare Other | Admitting: Physician Assistant

## 2014-10-07 VITALS — BP 120/80 | HR 78 | Temp 97.9°F | Resp 20 | Ht 64.0 in | Wt 157.0 lb

## 2014-10-07 DIAGNOSIS — I10 Essential (primary) hypertension: Secondary | ICD-10-CM | POA: Diagnosis not present

## 2014-10-07 DIAGNOSIS — Z23 Encounter for immunization: Secondary | ICD-10-CM | POA: Diagnosis not present

## 2014-10-07 DIAGNOSIS — E559 Vitamin D deficiency, unspecified: Secondary | ICD-10-CM

## 2014-10-07 DIAGNOSIS — M8580 Other specified disorders of bone density and structure, unspecified site: Secondary | ICD-10-CM

## 2014-10-07 LAB — BASIC METABOLIC PANEL WITH GFR
BUN: 18 mg/dL (ref 6–23)
CALCIUM: 9.3 mg/dL (ref 8.4–10.5)
CO2: 27 mEq/L (ref 19–32)
CREATININE: 0.69 mg/dL (ref 0.50–1.10)
Chloride: 104 mEq/L (ref 96–112)
GFR, Est African American: >89 mL/min
Glucose, Bld: 105 mg/dL — ABNORMAL HIGH (ref 70–99)
Potassium: 4.1 mEq/L (ref 3.5–5.3)
SODIUM: 142 meq/L (ref 135–145)

## 2014-10-07 NOTE — Addendum Note (Signed)
Addended by: Elvina Mattes T on: 10/07/2014 08:49 AM   Modules accepted: Orders

## 2014-10-07 NOTE — Progress Notes (Addendum)
Patient ID: Sarah Williams MRN: [id number], DOB: February 20, 1946, 69 y.o. Date of Encounter: 10/07/2014,   Chief Complaint: F/U HTN  HPI: 69 y.o. y/o white female here to f/u HTN.     "Homework" WRITTEN ON AVS AT OV 10/07/2014: sCHEULE mammogram and call regarding Zostavax    She recently had office visit with me on 02/18/2014 for CPE and also as a "New Patient".  The following is copied from that OV Note 02/18/2014:  Actually, she had been a patient here in the past but says that she then lost her insurance and had no insurance for a couple of years and that is why she has not been here recently.  Last office visit here was 12/15/2010. Last complete physical exam here was with me on 11/29/2009.  She says that 2 years ago her husband lost his job and they lost insurance at that time. Therefore she has had no medical care and has not been on any prescription medications during that time. Luckily, she says that she had no illnesses or medical problems during that period of time. Therefore, no new medical information to update compared to what was already in her paper chart from the past.  Unfortunately, because she had no insurance coverage, she has been off of her Norvasc 10 mg and benazepril 20mg ,  which he been prescribed prior to the insurance loss. Therefore today we are getting her blood pressure readings significantly elevated. Even with this, she still is reporting no angina symptoms and no TIA/CVA type symptoms and no chest pain or SOB.  At that visit on 02/18/14 I prescribed Norvasc 5 mg--told her to take 1 daily for 1 week then increase to 2 daily.                                      --Also prescribed benazepril 10 mg.--Told her to take 1 daily for 1 week then increase to 2 daily.   OV ----03/19/2014--- she says that she has been taking these medications as directed and as listed above. She says that she bought a new blood pressure cuff about 6 months ago that was expensive so  she thinks that should be accurate. Says that she has been checking her blood pressure 2 or 3 times per day. Says she is getting very different readings at different times but on average is around 128/130 over 70s. She has noticed no adverse effects from the medications.   OV 10/07/2014: She has no complaints today. She continues to take both blood pressure medications as directed with no adverse effects. Checks her blood pressure occasionally at home and is good getting good readings.   Review of Systems: Consitutional: No fever, chills, fatigue, night sweats, lymphadenopathy. No significant/unexplained weight changes. Eyes: No visual changes, eye redness, or discharge. ENT/Mouth: No ear pain, sore throat, nasal drainage, or sinus pain. Cardiovascular: No chest pressure,heaviness, tightness or squeezing, even with exertion. No increased shortness of breath or dyspnea on exertion.No palpitations, edema, orthopnea, PND. Respiratory: No cough, hemoptysis, SOB, or wheezing. Gastrointestinal: No anorexia, dysphagia, reflux, pain, nausea, vomiting, hematemesis, diarrhea, constipation, BRBPR, or melena. Breast: No mass, nodules, bulging, or retraction. No skin changes or inflammation. No nipple discharge. No lymphadenopathy. Genitourinary: No dysuria, hematuria, incontinence, vaginal discharge, pruritis, burning, abnormal bleeding, or pain. Musculoskeletal: No decreased ROM, No joint pain or swelling. No significant pain in neck, back, or extremities. Skin: No  rash, pruritis, or concerning lesions. Neurological: No headache, dizziness, syncope, seizures, tremors, memory loss, coordination problems, or paresthesias. Psychological: No anxiety, depression, hallucinations, SI/HI. Endocrine: No polydipsia, polyphagia, polyuria, or known diabetes.No increased fatigue. No palpitations/rapid heart rate. No significant/unexplained weight change. All other systems were reviewed and are otherwise  negative.  Past Medical History  Diagnosis Date  . Hypertension   . Osteoporosis   . Hyperlipidemia     11/2010  . PONV (postoperative nausea and vomiting)      Past Surgical History  Procedure Laterality Date  . Cosmetic surgery  03/08    blepharoplasty  . Breast surgery      Benign--Bilateral Lumpectomies--Benign  . Colonoscopy N/A 03/16/2014    Procedure: COLONOSCOPY;  Surgeon: Corbin Ade, MD;  [facility];  Service: Endoscopy;  Laterality: N/A;  2:15 PM    Home Meds:  Outpatient Prescriptions Prior to Visit  Medication Sig Dispense Refill  . amLODipine (NORVASC) 10 MG tablet Take 1 tablet (10 mg total) by mouth daily. 90 tablet 3  . benazepril (LOTENSIN) 20 MG tablet Take 1 tablet (20 mg total) by mouth daily. 90 tablet 3  . Cholecalciferol (VITAMIN D) 2000 UNITS tablet Take 2,000 Units by mouth every evening.    . Multiple Vitamin (MULTIVITAMIN) tablet Take 1 tablet by mouth every evening. Centrum Silver     No facility-administered medications prior to visit.    Allergies: No Known Allergies  History   Social History  . Marital Status: Married    Spouse Name: N/A  . Number of Children: N/A  . Years of Education: N/A   Occupational History  . Not on file.   Social History Main Topics  . Smoking status: Never Smoker   . Smokeless tobacco: Never Used  . Alcohol Use: 3.0 oz/week    5 Glasses of wine per week  . Drug Use: No  . Sexual Activity: Yes    Birth Control/ Protection: Post-menopausal   Other Topics Concern  . Not on file   Social History Narrative   Entered 02/2014:   Married.   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   Works about 16 hours a week now.   Says she "has to stay busy"--in summer works in yard, garden.    Always working on a project in the house etc.     Family History  Problem Relation Age of Onset  .  Hypertension Father   . Kidney disease Father   . Stroke Father   . Heart disease Mother 101    CAD  . Heart disease Paternal Grandfather 59    CAD, MI    Physical Exam: Blood pressure 120/80, pulse 78, temperature 97.9 F (36.6 C), temperature source Oral, resp. rate 20, height  (1.626 m), weight 157 lb (71.215 kg)., Body mass index is 26.94 kg/(m^2). General: Well developed, well nourished, WF. Appears in no acute distress.  Neck: Supple. Trachea midline. No thyromegaly. Full ROM. No lymphadenopathy.No Carotid Bruits. Lungs: Clear to auscultation bilaterally without wheezes, rales, or rhonchi. Breathing is of normal effort and unlabored. Cardiovascular: RRR with S1 S2. No murmurs, rubs, or gallops.  No carotid bruits.No LE Edema.  Musculoskeletal: Full range of motion and 5/5 strength throughout. Skin: Warm and moist.  No LE Edema. Neuro: A+Ox3. CN II-XII grossly intact. Moves all extremities spontaneously. Full sensation throughout. Normal gait. Psych:  Responds  to questions appropriately with a normal affect.   Assessment/Plan:  69 y.o. y/o female here for    THE FOLLOWING IS COPIED FROM HER CPE ON 02/18/2014:  CPE 1. Visit for preventive health examination   A. Screening Labs:   --------------------------------------------------  ALL OF THESE RESULTS WERE NORMAL------------------- She IS fasting.  - CBC with Differential - COMPLETE METABOLIC PANEL WITH GFR - Lipid panel - TSH - Vit D  25 hydroxy (rtn osteoporosis monitoring)  B. Pap: - PAP, Thin Prep w/HPV rflx HPV Type 16/18-------------------------------NORMAL----------------------------------   C. Screening Mammogram: Prior to loss of insurance, she had mammograms performed routinely at [facility]. She says that she did go have another mammogram performed at [facility] in either June or July of this year (2015)-- (once she had insurance again) states that this was negative. At OV 10/07/14 she states that she just  got a letter that she is due to schedule follow-up mammogram and she plans to go home and do this. 10/28/2014: Addendum added: Received Mammogram report from [facility]. Mammogram performed 10/23/2014. Negative.   D. DEXA/BMD:  At CPE 11/15 I scheduled her for DEXA scan follow-up. She had DEXA scan 03/05/14. --- Lumbar spine was normal at -0.2. Femur was -2.4 consistent with osteopenia. I compared these findings to her prior DEXA scan which had been 01/05/2010. At that time her worst site was her femur at -2.3. At that time I had reviewed her risk factors for osteoporosis and she only had one positive is being Caucasian. Were negative for smoking, frail, steroid-induced. Also negative for fracture. At that time had recommended calcium vitamin D and weightbearing exercise. Repeat 2 years.  Again at the time of her current DEXA 03/05/14 told her to continue calcium vitamin D and weightbearing exercise and repeat 2 years. Vitamin D level was within normal limits on 02/18/14 on current dose of vitamin D.  E. Colorectal Cancer Screening: She underwent colonoscopy with Dr. work 03/16/2014. Diverticulosis otherwise normal. Repeat 10 years.  F. Immunizations:  Influenza:   Received this here 02/2014 Tetanus: She received Tdap here 06/18/2007 Pneumococcal:  Prevnar 13 --Given here 02/2014.  Pneumovax 23--Given here 10/07/2014 Zostavax:  Today I wrote on her AVS--she is to call her insurance and find out their coverage of this vaccine and what her cost would be out of pocket. If she is agreeable to pay this cost, then she will call us and we will send an order to her pharmacy and she will go there to receive the vaccine.  2. Essential hypertension Blood pressure controlled and at goal. Continue current medications. Check lab to monitor.  3. H/O Hyperlipidemia Lipids were good 02/18/14. LDL 113. Does not require treatment.   4. Osteopenia See note above under Preventive Care  5. Vitamin D deficiency I  verified with patient and she says she is taking the vitamin D at the dose listed on her medicine list. - Vit D of all normal 02/18/2014 on current dose vitamin D.   Follow-up office visit in 6 months or sooner if needed.  "Homework" WRITTEN ON AVS AT OV 10/07/2014: sCHEULE mammogram and call regarding Zostavax   Signed, [address] [place], [place], [facility] 10/07/2014 8:11 AM

## 2014-10-07 NOTE — Telephone Encounter (Signed)
Pt request we call her pharmacy and OK administration of Zostavax.  Order called to pharmacy per provider approval.

## 2014-10-09 ENCOUNTER — Encounter: Payer: Self-pay | Admitting: Family Medicine

## 2014-10-23 LAB — HM MAMMOGRAPHY: HM MAMMO: NORMAL

## 2014-10-28 ENCOUNTER — Encounter: Payer: Self-pay | Admitting: Family Medicine

## 2015-04-08 ENCOUNTER — Encounter: Payer: Self-pay | Admitting: Physician Assistant

## 2015-04-08 ENCOUNTER — Ambulatory Visit (INDEPENDENT_AMBULATORY_CARE_PROVIDER_SITE_OTHER): Payer: Medicare Other | Admitting: Physician Assistant

## 2015-04-08 VITALS — BP 138/80 | HR 68 | Temp 97.6°F | Resp 18 | Ht 64.0 in | Wt 152.0 lb

## 2015-04-08 DIAGNOSIS — Z23 Encounter for immunization: Secondary | ICD-10-CM

## 2015-04-08 DIAGNOSIS — E559 Vitamin D deficiency, unspecified: Secondary | ICD-10-CM | POA: Diagnosis not present

## 2015-04-08 DIAGNOSIS — I10 Essential (primary) hypertension: Secondary | ICD-10-CM | POA: Diagnosis not present

## 2015-04-08 DIAGNOSIS — M8580 Other specified disorders of bone density and structure, unspecified site: Secondary | ICD-10-CM | POA: Diagnosis not present

## 2015-04-08 LAB — BASIC METABOLIC PANEL WITH GFR
BUN: 19 mg/dL (ref 7–25)
CALCIUM: 9.5 mg/dL (ref 8.6–10.4)
CO2: 26 mmol/L (ref 20–31)
Chloride: 104 mmol/L (ref 98–110)
Creat: 0.7 mg/dL (ref 0.50–0.99)
GFR, Est African American: >89 mL/min (ref >=60)
GFR, Est Non African American: >89 mL/min (ref >=60)
GLUCOSE: 89 mg/dL (ref 70–99)
Potassium: 4 mmol/L (ref 3.5–5.3)
Sodium: 143 mmol/L (ref 135–146)

## 2015-04-08 MED ORDER — BENAZEPRIL HCL 20 MG PO TABS: 20.0000 mg | ORAL_TABLET | Freq: Every day | ORAL | Status: DC

## 2015-04-08 MED ORDER — AMLODIPINE BESYLATE 10 MG PO TABS: 10.0000 mg | ORAL_TABLET | Freq: Every day | ORAL | Status: DC

## 2015-04-08 NOTE — Progress Notes (Signed)
Patient ID: Sarah Williams MRN: [id number], DOB: 05-04-1945, 69 y.o. Date of Encounter: 04/08/2015,   Chief Complaint: F/U HTN  HPI: 69 y.o. y/o white female here to f/u HTN.    She had office visit with me on 02/18/2014 for CPE and also as a "New Patient".  The following is copied from that OV Note 02/18/2014:  Actually, she had been a patient here in the past but says that she then lost her insurance and had no insurance for a couple of years and that is why she has not been here recently.  Last office visit here was 12/15/2010. Last complete physical exam here was with me on 11/29/2009.  She says that 2 years ago her husband lost his job and they lost insurance at that time. Therefore she has had no medical care and has not been on any prescription medications during that time. Luckily, she says that she had no illnesses or medical problems during that period of time. Therefore, no new medical information to update compared to what was already in her paper chart from the past.  Unfortunately, because she had no insurance coverage, she has been off of her Norvasc 10 mg and benazepril ,  which he been prescribed prior to the insurance loss. Therefore today we are getting her blood pressure readings significantly elevated. Even with this, she still is reporting no angina symptoms and no TIA/CVA type symptoms and no chest pain or SOB.  At that visit on 02/18/14 I prescribed Norvasc 5 mg--told her to take 1 daily for 1 week then increase to 2 daily.                                      --Also prescribed benazepril 10 mg.--Told her to take 1 daily for 1 week then increase to 2 daily.   OV ----03/19/2014--- she says that she has been taking these medications as directed and as listed above. She says that she bought a new blood pressure cuff about 6 months ago that was expensive so she thinks that should be accurate. Says that she has been checking her blood pressure 2 or 3 times per  day. Says she is getting very different readings at different times but on average is around 128/130 over 70s. She has noticed no adverse effects from the medications.   OV 10/07/2014: She has no complaints today. She continues to take both blood pressure medications as directed with no adverse effects. Checks her blood pressure occasionally at home and is good getting good readings.  OV 04/08/15: She has no complaints today. She continues to take both blood pressure medications as directed with no adverse effects. Checks her blood pressure occasionally at home and is good getting good readings.    Review of Systems: Consitutional: No fever, chills, fatigue, night sweats, lymphadenopathy. No significant/unexplained weight changes. Eyes: No visual changes, eye redness, or discharge. ENT/Mouth: No ear pain, sore throat, nasal drainage, or sinus pain. Cardiovascular: No chest pressure,heaviness, tightness or squeezing, even with exertion. No increased shortness of breath or dyspnea on exertion.No palpitations, edema, orthopnea, PND. Respiratory: No cough, hemoptysis, SOB, or wheezing. Gastrointestinal: No anorexia, dysphagia, reflux, pain, nausea, vomiting, hematemesis, diarrhea, constipation, BRBPR, or melena. Breast: No mass, nodules, bulging, or retraction. No skin changes or inflammation. No nipple discharge. No lymphadenopathy. Genitourinary: No dysuria, hematuria, incontinence, vaginal discharge, pruritis, burning, abnormal bleeding, or pain. Musculoskeletal:  No decreased ROM, No joint pain or swelling. No significant pain in neck, back, or extremities. Skin: No rash, pruritis, or concerning lesions. Neurological: No headache, dizziness, syncope, seizures, tremors, memory loss, coordination problems, or paresthesias. Psychological: No anxiety, depression, hallucinations, SI/HI. Endocrine: No polydipsia, polyphagia, polyuria, or known diabetes.No increased fatigue. No palpitations/rapid  heart rate. No significant/unexplained weight change. All other systems were reviewed and are otherwise negative.  Past Medical History  Diagnosis Date  . Hypertension   . Osteoporosis   . Hyperlipidemia     11/2010  . PONV (postoperative nausea and vomiting)      Past Surgical History  Procedure Laterality Date  . Cosmetic surgery  03/08    blepharoplasty  . Breast surgery      Benign--Bilateral Lumpectomies--Benign  . Colonoscopy N/A 03/16/2014    Procedure: COLONOSCOPY;  Surgeon: Corbin Ade, MD;  Location: [facility];  Service: Endoscopy;  Laterality: N/A;  2:15 PM    Home Meds:  Outpatient Prescriptions Prior to Visit  Medication Sig Dispense Refill  . amLODipine (NORVASC) 10 MG tablet Take 1 tablet (10 mg total) by mouth daily. 90 tablet 3  . benazepril (LOTENSIN) 20 MG tablet Take 1 tablet (20 mg total) by mouth daily. 90 tablet 3  . Cholecalciferol (VITAMIN D) 2000 UNITS tablet Take 2,000 Units by mouth every evening.    . Multiple Vitamin (MULTIVITAMIN) tablet Take 1 tablet by mouth every evening. Centrum Silver     No facility-administered medications prior to visit.    Allergies: No Known Allergies  Social History   Social History  . Marital Status: Married    Spouse Name: N/A  . Number of Children: N/A  . Years of Education: N/A   Occupational History  . Not on file.   Social History Main Topics  . Smoking status: Never Smoker   . Smokeless tobacco: Never Used  . Alcohol Use: 3.0 oz/week    5 Glasses of wine per week  . Drug Use: No  . Sexual Activity: Yes    Birth Control/ Protection: Post-menopausal   Other Topics Concern  . Not on file   Social History Narrative   Entered 02/2014:   Married.   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   Works about 16 hours a week now.   Says she "has to stay busy"--in summer works in yard, garden.      Always working on a project in the house etc.     Family History  Problem Relation Age of Onset  . Hypertension Father   . Kidney disease Father   . Stroke Father   . Heart disease Mother 79    CAD  . Heart disease Paternal Grandfather 20    CAD, MI    Physical Exam: Blood pressure 138/80, pulse 68, temperature 97.6 F (36.4 C), temperature source Oral, resp. rate 18, height  (1.626 m), weight 152 lb (68.947 kg)., Body mass index is 26.08 kg/(m^2). General: Well developed, well nourished, WF. Appears in no acute distress.  Neck: Supple. Trachea midline. No thyromegaly. Full ROM. No lymphadenopathy.No Carotid Bruits. Lungs: Clear to auscultation bilaterally without wheezes, rales, or rhonchi. Breathing is of normal effort and unlabored. Cardiovascular: RRR with S1 S2. No murmurs, rubs, or gallops.  No carotid bruits.No LE Edema.  Musculoskeletal: Full range of motion and 5/5 strength throughout. Skin: Warm and moist.  No  LE Edema. Neuro: A+Ox3. CN II-XII grossly intact. Moves all extremities spontaneously. Full sensation throughout. Normal gait. Psych:  Responds to questions appropriately with a normal affect.   Assessment/Plan:  69 y.o. y/o female here for    Essential hypertension Blood pressure controlled and at goal. Continue current medications. Check lab to monitor.  H/O Hyperlipidemia Lipids were good 02/18/14. LDL 113. Does not require treatment.   Osteopenia See note above under Preventive Care  Vitamin D deficiency I verified with patient and she says she is taking the vitamin D at the dose listed on her medicine list. - Vit D level normal 02/18/2014 on current dose vitamin D. Recheck Vitamin D Level 04/08/15  THE FOLLOWING IS COPIED FROM HER CPE ON 02/18/2014:  CPE 1. Visit for preventive health examination   A. Screening Labs:   --------------------------------------------------  ALL OF THESE RESULTS WERE NORMAL------------------- She IS fasting.   - CBC with Differential - COMPLETE METABOLIC PANEL WITH GFR - Lipid panel - TSH - Vit D  25 hydroxy (rtn osteoporosis monitoring)  B. Pap: - PAP, Thin Prep w/HPV rflx HPV Type 16/18-------------------------------NORMAL----------------------------------   C. Screening Mammogram: Prior to loss of insurance, [facility] She says that she did [facility] or July of this year (2015)-- (once she had insurance again) states that this was negative. At OV 10/07/14 she states that she just got a letter that she is due to schedule follow-up mammogram and she plans to go home and do this. 10/28/2014: Addendum added: [facility] Mammogram performed 10/23/2014. Negative.   D. DEXA/BMD:  At CPE 11/15 I scheduled her for DEXA scan follow-up. She had DEXA scan 03/05/14. --- Lumbar spine was normal at -0.2. Femur was -2.4 consistent with osteopenia. I compared these findings to her prior DEXA scan which had been 01/05/2010. At that time her worst site was her femur at -2.3. At that time I had reviewed her risk factors for osteoporosis and she only had one positive is being Caucasian. Were negative for smoking, frail, steroid-induced. Also negative for fracture. At that time had recommended calcium vitamin D and weightbearing exercise. Repeat 2 years.  Again at the time of her current DEXA 03/05/14 told her to continue calcium vitamin D and weightbearing exercise and repeat 2 years. Vitamin D level was within normal limits on 02/18/14 on current dose of vitamin D.  E. Colorectal Cancer Screening: She underwent colonoscopy with Dr. work 03/16/2014. Diverticulosis otherwise normal. Repeat 10 years.  F. Immunizations:  Influenza:   Received this here 02/2014, 04/08/2015 Tetanus: She received Tdap here 06/18/2007 Pneumococcal:  Prevnar 13 --Given here 02/2014.  Pneumovax 23--Given here  10/07/2014 Zostavax:  She received this 10/10/2014    Follow-up office visit in 6 months or sooner if needed.   Murray HodgkinsSigned, Marybell Robards [place], [place], [facility] 04/08/2015 8:23 AM

## 2015-04-09 ENCOUNTER — Encounter: Payer: Self-pay | Admitting: Family Medicine

## 2015-04-09 LAB — VITAMIN D 25 HYDROXY (VIT D DEFICIENCY, FRACTURES): Vit D, 25-Hydroxy: 47 ng/mL (ref 30–100)

## 2015-10-11 ENCOUNTER — Encounter: Payer: Self-pay | Admitting: Physician Assistant

## 2015-10-11 ENCOUNTER — Ambulatory Visit (INDEPENDENT_AMBULATORY_CARE_PROVIDER_SITE_OTHER): Payer: Medicare Other | Admitting: Physician Assistant

## 2015-10-11 VITALS — BP 134/72 | HR 78 | Temp 98.7°F | Resp 14 | Ht 64.0 in | Wt 155.0 lb

## 2015-10-11 DIAGNOSIS — E559 Vitamin D deficiency, unspecified: Secondary | ICD-10-CM

## 2015-10-11 DIAGNOSIS — M8580 Other specified disorders of bone density and structure, unspecified site: Secondary | ICD-10-CM

## 2015-10-11 DIAGNOSIS — I10 Essential (primary) hypertension: Secondary | ICD-10-CM | POA: Diagnosis not present

## 2015-10-11 LAB — BASIC METABOLIC PANEL WITH GFR
BUN: 21 mg/dL (ref 7–25)
CALCIUM: 9.3 mg/dL (ref 8.6–10.4)
CO2: 25 mmol/L (ref 20–31)
CREATININE: 0.55 mg/dL (ref 0.50–0.99)
Chloride: 107 mmol/L (ref 98–110)
GFR, Est African American: >89 mL/min (ref >=60)
GFR, Est Non African American: >89 mL/min (ref >=60)
Glucose, Bld: 104 mg/dL — ABNORMAL HIGH (ref 70–99)
Potassium: 3.9 mmol/L (ref 3.5–5.3)
SODIUM: 143 mmol/L (ref 135–146)

## 2015-10-11 NOTE — Progress Notes (Signed)
Patient ID: Sarah Williams MRN: [id number], DOB: 1945-10-15, 70 y.o. Date of Encounter: 10/11/2015,   Chief Complaint: F/U HTN  HPI: 70 y.o. y/o white female here to f/u HTN.    She had office visit with me on 02/18/2014 for CPE and also as a "New Patient".  The following is copied from that OV Note 02/18/2014:  Actually, she had been a patient here in the past but says that she then lost her insurance and had no insurance for a couple of years and that is why she has not been here recently.  Last office visit here was 12/15/2010. Last complete physical exam here was with me on 11/29/2009.  She says that 2 years ago her husband lost his job and they lost insurance at that time. Therefore she has had no medical care and has not been on any prescription medications during that time. Luckily, she says that she had no illnesses or medical problems during that period of time. Therefore, no new medical information to update compared to what was already in her paper chart from the past.  Unfortunately, because she had no insurance coverage, she has been off of her Norvasc 10 mg and benazepril 20mg ,  which he been prescribed prior to the insurance loss. Therefore today we are getting her blood pressure readings significantly elevated. Even with this, she still is reporting no angina symptoms and no TIA/CVA type symptoms and no chest pain or SOB.  At that visit on 02/18/14 I prescribed Norvasc 5 mg--told her to take 1 daily for 1 week then increase to 2 daily.                                      --Also prescribed benazepril 10 mg.--Told her to take 1 daily for 1 week then increase to 2 daily.   OV ----03/19/2014--- she says that she has been taking these medications as directed and as listed above. She says that she bought a new blood pressure cuff about 6 months ago that was expensive so she thinks that should be accurate. Says that she has been checking her blood pressure 2 or 3 times per  day. Says she is getting very different readings at different times but on average is around 128/130 over 70s. She has noticed no adverse effects from the medications.   OV 10/07/2014: She has no complaints today. She continues to take both blood pressure medications as directed with no adverse effects. Checks her blood pressure occasionally at home and is good getting good readings.  OV 04/08/15: She has no complaints today. She continues to take both blood pressure medications as directed with no adverse effects. Checks her blood pressure occasionally at home and is good getting good readings.  10/11/2015: She is taking both BP meds as directed. No adverse effects. No lightheadedness. No lower extremity edema.  She is taking Vit D at dose directed in past.  No complaints or concerns today.   Review of Systems: Consitutional: No fever, chills, fatigue, night sweats, lymphadenopathy. No significant/unexplained weight changes. Eyes: No visual changes, eye redness, or discharge. ENT/Mouth: No ear pain, sore throat, nasal drainage, or sinus pain. Cardiovascular: No chest pressure,heaviness, tightness or squeezing, even with exertion. No increased shortness of breath or dyspnea on exertion.No palpitations, edema, orthopnea, PND. Respiratory: No cough, hemoptysis, SOB, or wheezing. Gastrointestinal: No anorexia, dysphagia, reflux, pain, nausea, vomiting, hematemesis, diarrhea,  constipation, BRBPR, or melena. Breast: No mass, nodules, bulging, or retraction. No skin changes or inflammation. No nipple discharge. No lymphadenopathy. Genitourinary: No dysuria, hematuria, incontinence, vaginal discharge, pruritis, burning, abnormal bleeding, or pain. Musculoskeletal: No decreased ROM, No joint pain or swelling. No significant pain in neck, back, or extremities. Skin: No rash, pruritis, or concerning lesions. Neurological: No headache, dizziness, syncope, seizures, tremors, memory loss, coordination  problems, or paresthesias. Psychological: No anxiety, depression, hallucinations, SI/HI. Endocrine: No polydipsia, polyphagia, polyuria, or known diabetes.No increased fatigue. No palpitations/rapid heart rate. No significant/unexplained weight change. All other systems were reviewed and are otherwise negative.  Past Medical History  Diagnosis Date  . Hypertension   . Osteoporosis   . Hyperlipidemia     11/2010  . PONV (postoperative nausea and vomiting)      Past Surgical History  Procedure Laterality Date  . Cosmetic surgery  03/08    blepharoplasty  . Breast surgery      Benign--Bilateral Lumpectomies--Benign  . Colonoscopy N/A 03/16/2014    Procedure: COLONOSCOPY;  Surgeon: Corbin Adeobert M Rourk, MD;  Location[facility];  Service: Endoscopy;  Laterality: N/A;  2:15 PM    Home Meds:  Outpatient Prescriptions Prior to Visit  Medication Sig Dispense Refill  . amLODipine (NORVASC) 10 MG tablet Take 1 tablet (10 mg total) by mouth daily. 90 tablet 3  . benazepril (LOTENSIN) 20 MG tablet Take 1 tablet (20 mg total) by mouth daily. 90 tablet 3  . Cholecalciferol (VITAMIN D) 2000 UNITS tablet Take 2,000 Units by mouth every evening.    . Multiple Vitamin (MULTIVITAMIN) tablet Take 1 tablet by mouth every evening. Centrum Silver     No facility-administered medications prior to visit.    Allergies: No Known Allergies  Social History   Social History  . Marital Status: Married    Spouse Name: N/A  . Number of Children: N/A  . Years of Education: N/A   Occupational History  . Not on file.   Social History Main Topics  . Smoking status: Never Smoker   . Smokeless tobacco: Never Used  . Alcohol Use: 3.0 oz/week    5 Glasses of wine per week  . Drug Use: No  . Sexual Activity: Yes    Birth Control/ Protection: Post-menopausal   Other Topics Concern  . Not on file   Social History Narrative   Entered 02/2014:   Married.   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   Works about 16 hours a week now.   Says she "has to stay busy"--in summer works in yard, garden.    Always working on a project in the house etc.     Family History  Problem Relation Age of Onset  . Hypertension Father   . Kidney disease Father   . Stroke Father   . Heart disease Mother [id number]    CAD  . Heart disease Paternal Grandfather 5165    CAD, MI    Physical Exam: Blood pressure 134/72, pulse 78, temperature 98.7 F (37.1 C), temperature source Oral, resp. rate 14, height 5\' 4"  (1.626 m), weight 155 lb (70.308 kg)., Body mass index is 26.59 kg/(m^2). General: Well developed, well nourished, WF. Appears in no acute distress.  Neck: Supple. Trachea midline. No thyromegaly. Full ROM. No lymphadenopathy.No Carotid Bruits. Lungs: Clear to auscultation bilaterally without wheezes, rales, or rhonchi. Breathing is of normal effort  and unlabored. Cardiovascular: RRR with S1 S2. No murmurs, rubs, or gallops.  No carotid bruits.No LE Edema.  Musculoskeletal: Full range of motion and 5/5 strength throughout. Skin: Warm and moist.  No LE Edema. Neuro: A+Ox3. CN II-XII grossly intact. Moves all extremities spontaneously. Full sensation throughout. Normal gait. Psych:  Responds to questions appropriately with a normal affect.   Assessment/Plan:  70 y.o. y/o female here for    Essential hypertension Blood pressure controlled and at goal. Continue current medications. Check lab to monitor.  H/O Hyperlipidemia Lipids were good 02/18/14. LDL 113. Does not require treatment.   Osteopenia See note above under Preventive Care  Vitamin D deficiency I verified with patient and she says she is taking the vitamin D at the dose listed on her medicine list. - Vit D level normal 02/18/2014 on current dose vitamin D. Recheck Vitamin D Level 04/08/15---level was 47---this was stable from the  check 6 months prior--at which time Vit D level was 44.  THE FOLLOWING IS COPIED FROM HER CPE ON 02/18/2014:  CPE 1. Visit for preventive health examination   A. Screening Labs:   --------------------------------------------------  ALL OF THESE RESULTS WERE NORMAL------------------- She IS fasting.  - CBC with Differential - COMPLETE METABOLIC PANEL WITH GFR - Lipid panel - TSH - Vit D  25 hydroxy (rtn osteoporosis monitoring)  B. Pap: - PAP, Thin Prep w/HPV rflx HPV Type 16/18-------------------------------NORMAL----------------------------------   C. Screening Mammogram: Prior to loss of insurance, she had mammograms performed routinely at [facility]. She says that she did go have another mammogram performed at [facility] in either June or July of this year (2015)-- (once she had insurance again) states that this was negative. At OV 10/07/14 she states that she just got a letter that she is due to schedule follow-up mammogram and she plans to go home and do this. 10/28/2014: Addendum added: Received Mammogram report from [facility]. Mammogram performed 10/23/2014. Negative.  At OV 10/11/15---Discussed that mammo due soon---she says she has received a letter and will f/u with scheduling this  D. DEXA/BMD:  At CPE 11/15 I scheduled her for DEXA scan follow-up. She had DEXA scan 03/05/14. --- Lumbar spine was normal at -0.2. Femur was -2.4 consistent with osteopenia. I compared these findings to her prior DEXA scan which had been 01/05/2010. At that time her worst site was her femur at -2.3. At that time I had reviewed her risk factors for osteoporosis and she only had one positive is being Caucasian. Were negative for smoking, frail, steroid-induced. Also negative for fracture. At that time had recommended calcium vitamin D and weightbearing exercise. Repeat 2 years.  Again at the time of her current DEXA 03/05/14 told her to continue calcium vitamin D and weightbearing exercise and repeat 2  years. Vitamin D level was within normal limits on 02/18/14 on current dose of vitamin D.  At OV 10/11/15---discussed that DEXA will be due 02/2016---she says she goes to 2 different places for her mammo and another place for DEXA--she wants to cont to go to 2 separate places, 2 separate times.  E. Colorectal Cancer Screening: She underwent colonoscopy with Dr. Kendell Bane 03/16/2014. Diverticulosis otherwise normal. Repeat 10 years.  F. Immunizations:  Influenza:   Received this here 02/2014, 04/08/2015 Tetanus: She received Tdap here 06/18/2007 Pneumococcal:  Prevnar 13 --Given here 02/2014.  Pneumovax 23--Given here 10/07/2014 Zostavax:  She received this 10/10/2014    Follow-up office visit in 6 months or sooner if needed.   Signed, Shon Hale  Dixon, [place], [facility] 10/11/2015 8:27 AM

## 2015-11-18 LAB — HM MAMMOGRAPHY: HM Mammogram: NORMAL (ref 0–4)

## 2015-12-10 ENCOUNTER — Encounter: Payer: Self-pay | Admitting: Physician Assistant

## 2016-04-13 ENCOUNTER — Encounter: Payer: Self-pay | Admitting: Physician Assistant

## 2016-04-13 ENCOUNTER — Ambulatory Visit (INDEPENDENT_AMBULATORY_CARE_PROVIDER_SITE_OTHER): Payer: Medicare Other | Admitting: Physician Assistant

## 2016-04-13 VITALS — BP 120/70 | HR 69 | Temp 97.6°F | Resp 16 | Wt 146.0 lb

## 2016-04-13 DIAGNOSIS — I10 Essential (primary) hypertension: Secondary | ICD-10-CM

## 2016-04-13 DIAGNOSIS — Z23 Encounter for immunization: Secondary | ICD-10-CM | POA: Diagnosis not present

## 2016-04-13 DIAGNOSIS — M8580 Other specified disorders of bone density and structure, unspecified site: Secondary | ICD-10-CM

## 2016-04-13 DIAGNOSIS — E559 Vitamin D deficiency, unspecified: Secondary | ICD-10-CM | POA: Diagnosis not present

## 2016-04-13 LAB — BASIC METABOLIC PANEL WITH GFR
BUN: 17 mg/dL (ref 7–25)
CHLORIDE: 104 mmol/L (ref 98–110)
CO2: 27 mmol/L (ref 20–31)
CREATININE: 0.65 mg/dL (ref 0.50–0.99)
Calcium: 9.3 mg/dL (ref 8.6–10.4)
GFR, Est African American: >89 mL/min (ref >=60)
GFR, Est Non African American: >89 mL/min (ref >=60)
GLUCOSE: 86 mg/dL (ref 70–99)
Potassium: 4.2 mmol/L (ref 3.5–5.3)
Sodium: 142 mmol/L (ref 135–146)

## 2016-04-13 NOTE — Addendum Note (Signed)
Addended by: Phineas SemenJOHNSON, TIFFANY A on: 04/13/2016 04:48 PM   Modules accepted: Orders

## 2016-04-13 NOTE — Progress Notes (Signed)
Patient ID: Sarah Williams MRN: [id number], DOB: 07/30/1945, 70 y.o. Date of Encounter: 04/13/2016,   Chief Complaint: F/U HTN  HPI: 70 y.o. y/o white female here to f/u HTN.    She had office visit with me on 02/18/2014 for CPE and also as a "New Patient".  The following is copied from that OV Note 02/18/2014:  Actually, she had been a patient here in the past but says that she then lost her insurance and had no insurance for a couple of years and that is why she has not been here recently.  Last office visit here was 12/15/2010. Last complete physical exam here was with me on 11/29/2009.  She says that 2 years ago her husband lost his job and they lost insurance at that time. Therefore she has had no medical care and has not been on any prescription medications during that time. Luckily, she says that she had no illnesses or medical problems during that period of time. Therefore, no new medical information to update compared to what was already in her paper chart from the past.  Unfortunately, because she had no insurance coverage, she has been off of her Norvasc 10 mg and benazepril 20mg ,  which he been prescribed prior to the insurance loss. Therefore today we are getting her blood pressure readings significantly elevated. Even with this, she still is reporting no angina symptoms and no TIA/CVA type symptoms and no chest pain or SOB.  At that visit on 02/18/14 I prescribed Norvasc 5 mg--told her to take 1 daily for 1 week then increase to 2 daily.                                      --Also prescribed benazepril 10 mg.--Told her to take 1 daily for 1 week then increase to 2 daily.   OV ----03/19/2014--- she says that she has been taking these medications as directed and as listed above. She says that she bought a new blood pressure cuff about 6 months ago that was expensive so she thinks that should be accurate. Says that she has been checking her blood pressure 2 or 3 times per  day. Says she is getting very different readings at different times but on average is around 128/130 over 70s. She has noticed no adverse effects from the medications.   OV 10/07/2014: She has no complaints today. She continues to take both blood pressure medications as directed with no adverse effects. Checks her blood pressure occasionally at home and is getting good readings.  OV 04/08/15: She has no complaints today. She continues to take both blood pressure medications as directed with no adverse effects. Checks her blood pressure occasionally at home and is good getting good readings.  10/11/2015: She is taking both BP meds as directed. No adverse effects. No lightheadedness. No lower extremity edema.  She is taking Vit D at dose directed in past.  No complaints or concerns today.   04/13/2016: She is taking blood pressure medications as directed. She is having no lightheadedness. No lower extremity edema. No other adverse effects. She continues to occasionally check her blood pressure at home and continues to get good readings. She is continuing to take the vitamin D at the dose directed in the past which is on her med list. She has no complaints or concerns today. She has not had the flu shot yet and  does want to get the flu vaccine today.  Review of Systems: Consitutional: No fever, chills, fatigue, night sweats, lymphadenopathy. No significant/unexplained weight changes. Eyes: No visual changes, eye redness, or discharge. ENT/Mouth: No ear pain, sore throat, nasal drainage, or sinus pain. Cardiovascular: No chest pressure,heaviness, tightness or squeezing, even with exertion. No increased shortness of breath or dyspnea on exertion.No palpitations, edema, orthopnea, PND. Respiratory: No cough, hemoptysis, SOB, or wheezing. Gastrointestinal: No anorexia, dysphagia, reflux, pain, nausea, vomiting, hematemesis, diarrhea, constipation, BRBPR, or melena. Breast: No mass, nodules,  bulging, or retraction. No skin changes or inflammation. No nipple discharge. No lymphadenopathy. Genitourinary: No dysuria, hematuria, incontinence, vaginal discharge, pruritis, burning, abnormal bleeding, or pain. Musculoskeletal: No decreased ROM, No joint pain or swelling. No significant pain in neck, back, or extremities. Skin: No rash, pruritis, or concerning lesions. Neurological: No headache, dizziness, syncope, seizures, tremors, memory loss, coordination problems, or paresthesias. Psychological: No anxiety, depression, hallucinations, SI/HI. Endocrine: No polydipsia, polyphagia, polyuria, or known diabetes.No increased fatigue. No palpitations/rapid heart rate. No significant/unexplained weight change. All other systems were reviewed and are otherwise negative.  Past Medical History:  Diagnosis Date  . Hyperlipidemia    11/2010  . Hypertension   . Osteoporosis   . PONV (postoperative nausea and vomiting)      Past Surgical History:  Procedure Laterality Date  . BREAST SURGERY     Benign--Bilateral Lumpectomies--Benign  . COLONOSCOPY N/A 03/16/2014   Procedure: COLONOSCOPY;  Surgeon: Corbin Ade, MD;  Location: [facility];  Service: Endoscopy;  Laterality: N/A;  2:15 PM  . COSMETIC SURGERY  03/08   blepharoplasty    Home Meds:  Outpatient Medications Prior to Visit  Medication Sig Dispense Refill  . amLODipine (NORVASC) 10 MG tablet Take 1 tablet (10 mg total) by mouth daily. 90 tablet 3  . benazepril (LOTENSIN) 20 MG tablet Take 1 tablet (20 mg total) by mouth daily. 90 tablet 3  . Cholecalciferol (VITAMIN D) 2000 UNITS tablet Take 2,000 Units by mouth every evening.    . Multiple Vitamin (MULTIVITAMIN) tablet Take 1 tablet by mouth every evening. Centrum Silver     No facility-administered medications prior to visit.     Allergies: No Known Allergies  Social History   Social History  . Marital status: Married    Spouse name: N/A  . Number of children:  N/A  . Years of education: N/A   Occupational History  . Not on file.   Social History Main Topics  . Smoking status: Never Smoker  . Smokeless tobacco: Never Used  . Alcohol use 3.0 oz/week    5 Glasses of wine per week  . Drug use: No  . Sexual activity: Yes    Birth control/ protection: Post-menopausal   Other Topics Concern  . Not on file   Social History Narrative   Entered 02/2014:   Married.   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   Works about 16 hours a week now.   Says she "has to stay busy"--in summer works in yard, garden.    Always working on a project in the house etc.     Family History  Problem Relation Age of Onset  . Hypertension Father   . Kidney disease Father   . Stroke Father   . Heart disease Mother 94    CAD  . Heart disease Paternal Grandfather 51  CAD, MI    Physical Exam: Blood pressure 120/70, pulse 69, temperature 97.6 F (36.4 C), temperature source Oral, resp. rate 16, weight 146 lb (66.2 kg), SpO2 98 %., There is no height or weight on file to calculate BMI. General: Well developed, well nourished, WF. Appears in no acute distress.  Neck: Supple. Trachea midline. No thyromegaly. Full ROM. No lymphadenopathy.No Carotid Bruits. Lungs: Clear to auscultation bilaterally without wheezes, rales, or rhonchi. Breathing is of normal effort and unlabored. Cardiovascular: RRR with S1 S2. No murmurs, rubs, or gallops.  No carotid bruits.No LE Edema.  Musculoskeletal: Full range of motion and 5/5 strength throughout. Skin: Warm and moist.  No LE Edema. Neuro: A+Ox3. CN II-XII grossly intact. Moves all extremities spontaneously. Full sensation throughout. Normal gait. Psych:  Responds to questions appropriately with a normal affect.   Assessment/Plan:  70 y.o. y/o female here for    Essential hypertension 04/13/2016: Blood pressure  controlled and at goal. Continue current medications. Check lab to monitor.  H/O Hyperlipidemia 04/13/2016: Lipids were good 02/18/14. LDL 113. Does not require treatment.   Osteopenia 04/13/2016: See note above under Preventive Care  Vitamin D deficiency I verified with patient and she says she is taking the vitamin D at the dose listed on her medicine list. - Vit D level normal 02/18/2014 on current dose vitamin D. Recheck Vitamin D Level 04/08/15---level was 47---this was stable from the check 6 months prior--at which time Vit D level was 44. 04/13/2016: Vitamin D levels have been stable the last 2 checks over the last 2 years. Will not recheck level today. Continue current dose of vitamin D.  THE FOLLOWING IS COPIED FROM HER CPE ON 02/18/2014:  CPE 1. Visit for preventive health examination   A. Screening Labs:   --------------------------------------------------  ALL OF THESE RESULTS WERE NORMAL------------------- She IS fasting.  - CBC with Differential - COMPLETE METABOLIC PANEL WITH GFR - Lipid panel - TSH - Vit D  25 hydroxy (rtn osteoporosis monitoring)  B. Pap: - PAP, Thin Prep w/HPV rflx HPV Type 16/18-------------------------------NORMAL----------------------------------   C. Screening Mammogram: Prior to loss of insurance, she had mammograms performed routinely at [facility]. She says that she did go have another mammogram performed at [facility] in either June or July of this year (2015)-- (once she had insurance again) states that this was negative. At OV 10/07/14 she states that she just got a letter that she is due to schedule follow-up mammogram and she plans to go home and do this. 10/28/2014: Addendum added: Received Mammogram report from [facility]. Mammogram performed 10/23/2014. Negative.  At OV 10/11/15---Discussed that mammo due soon---she says she has received a letter and will f/u with scheduling this At OV 04/13/2016: Reviewed that she did have mammogram  11/18/2015  D. DEXA/BMD:  At CPE 11/15 I scheduled her for DEXA scan follow-up. She had DEXA scan 03/05/14. --- Lumbar spine was normal at -0.2. Femur was -2.4 consistent with osteopenia. I compared these findings to her prior DEXA scan which had been 01/05/2010. At that time her worst site was her femur at -2.3. At that time I had reviewed her risk factors for osteoporosis and she only had one positive is being Caucasian. Were negative for smoking, frail, steroid-induced. Also negative for fracture. At that time had recommended calcium vitamin D and weightbearing exercise. Repeat 2 years.  Again at the time of her current DEXA 03/05/14 told her to continue calcium vitamin D and weightbearing exercise and repeat 2 years. Vitamin D level  was within normal limits on 02/18/14 on current dose of vitamin D.  At OV 10/11/15---discussed that DEXA will be due 02/2016---she says she goes to 2 different places for her mammo and another place for DEXA--she wants to cont to go to 2 separate places, 2 separate times.  04/13/2016: She has not had follow-up DEXA scan. Thinks that we have to place an order in order for her to go. Today I have placed order for her to have DEXA scan at the same facility that she had her last one.  E. Colorectal Cancer Screening: She underwent colonoscopy with Dr. Kendell Baneourke 03/16/2014. Diverticulosis otherwise normal. Repeat 10 years.  F. Immunizations:  Influenza:   Received this here 02/2014, 04/08/2015, 04/13/2016 Tetanus: She received Tdap here 06/18/2007 Pneumococcal:  Prevnar 13 --Given here 02/2014.  Pneumovax 23--Given here 10/07/2014 Zostavax:  She received this 10/10/2014    Follow-up office visit in 6 months or sooner if needed.   Murray HodgkinsSigned, Kechia Yahnke Beth BellflowerDixon, [place]PA, [facility] 04/13/2016 8:04 AM

## 2016-04-15 ENCOUNTER — Other Ambulatory Visit: Payer: Self-pay | Admitting: Physician Assistant

## 2016-04-15 DIAGNOSIS — I10 Essential (primary) hypertension: Secondary | ICD-10-CM

## 2016-04-21 ENCOUNTER — Telehealth: Payer: Self-pay | Admitting: *Deleted

## 2016-04-21 NOTE — Telephone Encounter (Signed)
Call placed to patient. LMTRC.   Bone Density is scheduled as follows:  04/26/2016 10:45am [facility]  Stop taking Calcium 48 hour prior to scan. Bring list of medications to scan.

## 2016-04-21 NOTE — Telephone Encounter (Signed)
Patient returned call and made aware.

## 2016-04-26 ENCOUNTER — Ambulatory Visit (HOSPITAL_COMMUNITY)
Admission: RE | Admit: 2016-04-26 | Discharge: 2016-04-26 | Disposition: A | Discharge status: Home / Self Care | Payer: Medicare HMO | Source: Ambulatory Visit | Attending: Physician Assistant | Admitting: Physician Assistant

## 2016-04-26 DIAGNOSIS — Z780 Asymptomatic menopausal state: Secondary | ICD-10-CM | POA: Insufficient documentation

## 2016-04-26 DIAGNOSIS — M8580 Other specified disorders of bone density and structure, unspecified site: Secondary | ICD-10-CM

## 2016-04-26 DIAGNOSIS — M85852 Other specified disorders of bone density and structure, left thigh: Secondary | ICD-10-CM | POA: Diagnosis not present

## 2016-04-26 DIAGNOSIS — Z13820 Encounter for screening for osteoporosis: Secondary | ICD-10-CM | POA: Diagnosis not present

## 2016-05-01 DIAGNOSIS — R69 Illness, unspecified: Secondary | ICD-10-CM | POA: Diagnosis not present

## 2016-06-15 ENCOUNTER — Encounter: Payer: Self-pay | Admitting: Physician Assistant

## 2016-10-12 ENCOUNTER — Ambulatory Visit (INDEPENDENT_AMBULATORY_CARE_PROVIDER_SITE_OTHER): Payer: Medicare HMO | Admitting: Physician Assistant

## 2016-10-12 ENCOUNTER — Encounter: Payer: Self-pay | Admitting: Physician Assistant

## 2016-10-12 VITALS — BP 140/76 | HR 65 | Temp 97.3°F | Resp 18 | Wt 139.8 lb

## 2016-10-12 DIAGNOSIS — I10 Essential (primary) hypertension: Secondary | ICD-10-CM

## 2016-10-12 DIAGNOSIS — E559 Vitamin D deficiency, unspecified: Secondary | ICD-10-CM

## 2016-10-12 DIAGNOSIS — M8580 Other specified disorders of bone density and structure, unspecified site: Secondary | ICD-10-CM | POA: Diagnosis not present

## 2016-10-12 NOTE — Progress Notes (Signed)
Patient ID: Sarah Williams MRN: [id number], DOB: 02/07/1946, 71 y.o. Date of Encounter: 10/12/2016,   Chief Complaint: F/U HTN  HPI: 71 y.o. y/o white female here to f/u HTN.    She had office visit with me on 02/18/2014 for CPE and also as a "New Patient".  The following is copied from that OV Note 02/18/2014:  Actually, she had been a patient here in the past but says that she then lost her insurance and had no insurance for a couple of years and that is why she has not been here recently.  Last office visit here was 12/15/2010. Last complete physical exam here was with me on 11/29/2009.  She says that 2 years ago her husband lost his job and they lost insurance at that time. Therefore she has had no medical care and has not been on any prescription medications during that time. Luckily, she says that she had no illnesses or medical problems during that period of time. Therefore, no new medical information to update compared to what was already in her paper chart from the past.  Unfortunately, because she had no insurance coverage, she has been off of her Norvasc 10 mg and benazepril 20mg ,  which he been prescribed prior to the insurance loss. Therefore today we are getting her blood pressure readings significantly elevated. Even with this, she still is reporting no angina symptoms and no TIA/CVA type symptoms and no chest pain or SOB.  At that visit on 02/18/14 I prescribed Norvasc 5 mg--told her to take 1 daily for 1 week then increase to 2 daily.                                      --Also prescribed benazepril 10 mg.--Told her to take 1 daily for 1 week then increase to 2 daily.   OV ----03/19/2014--- she says that she has been taking these medications as directed and as listed above. She says that she bought a new blood pressure cuff about 6 months ago that was expensive so she thinks that should be accurate. Says that she has been checking her blood pressure 2 or 3 times per  day. Says she is getting very different readings at different times but on average is around 128/130 over 70s. She has noticed no adverse effects from the medications.   OV 10/07/2014: She has no complaints today. She continues to take both blood pressure medications as directed with no adverse effects. Checks her blood pressure occasionally at home and is getting good readings.  OV 04/08/15: She has no complaints today. She continues to take both blood pressure medications as directed with no adverse effects. Checks her blood pressure occasionally at home and is good getting good readings.  10/11/2015: She is taking both BP meds as directed. No adverse effects. No lightheadedness. No lower extremity edema.  She is taking Vit D at dose directed in past.  No complaints or concerns today.   04/13/2016: She is taking blood pressure medications as directed. She is having no lightheadedness. No lower extremity edema. No other adverse effects. She continues to occasionally check her blood pressure at home and continues to get good readings. She is continuing to take the vitamin D at the dose directed in the past which is on her med list. She has no complaints or concerns today. She has not had the flu shot yet and  does want to get the flu vaccine today.  10/12/2016: Today I reviewed with her the findings on her DEXA scan from 04/26/16--- see assessment plan section below She continues to take her blood pressure medications as directed. Continues to have no adverse effects. No lightheadedness. No extremity edema. Says that her blood pressure is a little higher today because her husband was running late getting here for their appointment this morning. She continues to check blood pressure occasionally at home and continues to get good readings. She is taking the calcium and vitamin D as directed. Says that her husband is trying to get her to join the gym and knows that the weightbearing exercise would  help regarding her bones. She has no concerns to address today. Says that she has been feeling well.  Review of Systems: Consitutional: No fever, chills, fatigue, night sweats, lymphadenopathy. No significant/unexplained weight changes. Eyes: No visual changes, eye redness, or discharge. ENT/Mouth: No ear pain, sore throat, nasal drainage, or sinus pain. Cardiovascular: No chest pressure,heaviness, tightness or squeezing, even with exertion. No increased shortness of breath or dyspnea on exertion.No palpitations, edema, orthopnea, PND. Respiratory: No cough, hemoptysis, SOB, or wheezing. Gastrointestinal: No anorexia, dysphagia, reflux, pain, nausea, vomiting, hematemesis, diarrhea, constipation, BRBPR, or melena. Breast: No mass, nodules, bulging, or retraction. No skin changes or inflammation. No nipple discharge. No lymphadenopathy. Genitourinary: No dysuria, hematuria, incontinence, vaginal discharge, pruritis, burning, abnormal bleeding, or pain. Musculoskeletal: No decreased ROM, No joint pain or swelling. No significant pain in neck, back, or extremities. Skin: No rash, pruritis, or concerning lesions. Neurological: No headache, dizziness, syncope, seizures, tremors, memory loss, coordination problems, or paresthesias. Psychological: No anxiety, depression, hallucinations, SI/HI. Endocrine: No polydipsia, polyphagia, polyuria, or known diabetes.No increased fatigue. No palpitations/rapid heart rate. No significant/unexplained weight change. All other systems were reviewed and are otherwise negative.  Past Medical History:  Diagnosis Date  . Hyperlipidemia    11/2010  . Hypertension   . Osteoporosis   . PONV (postoperative nausea and vomiting)      Past Surgical History:  Procedure Laterality Date  . BREAST SURGERY     Benign--Bilateral Lumpectomies--Benign  . COLONOSCOPY N/A 03/16/2014   Procedure: COLONOSCOPY;  Surgeon: Corbin Ade, MD;  Location: [facility];  Service:  Endoscopy;  Laterality: N/A;  2:15 PM  . COSMETIC SURGERY  03/08   blepharoplasty    Home Meds:  Outpatient Medications Prior to Visit  Medication Sig Dispense Refill  . amLODipine (NORVASC) 10 MG tablet TAKE ONE TABLET BY MOUTH ONCE DAILY 90 tablet 3  . benazepril (LOTENSIN) 20 MG tablet TAKE ONE TABLET BY MOUTH ONCE DAILY 90 tablet 3  . Cholecalciferol (VITAMIN D) 2000 UNITS tablet Take 2,000 Units by mouth every evening.    . Multiple Vitamin (MULTIVITAMIN) tablet Take 1 tablet by mouth every evening. Centrum Silver     No facility-administered medications prior to visit.     Allergies: No Known Allergies  Social History   Social History  . Marital status: Married    Spouse name: N/A  . Number of children: N/A  . Years of education: N/A   Occupational History  . Not on file.   Social History Main Topics  . Smoking status: Never Smoker  . Smokeless tobacco: Never Used  . Alcohol use 3.0 oz/week    5 Glasses of wine per week  . Drug use: No  . Sexual activity: Yes    Birth control/ protection: Post-menopausal   Other Topics Concern  .  Not on file   Social History Narrative   Entered 02/2014:   Married.   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   Works about 16 hours a week now.   Says she "has to stay busy"--in summer works in yard, garden.    Always working on a project in the house etc.     Family History  Problem Relation Age of Onset  . Hypertension Father   . Kidney disease Father   . Stroke Father   . Heart disease Mother 39       CAD  . Heart disease Paternal Grandfather 66       CAD, MI    Physical Exam: Blood pressure 140/76, pulse 65, temperature 97.3 F (36.3 C), temperature source Oral, resp. rate 18, weight 139 lb 12.8 oz (63.4 kg), SpO2 99 %., Body mass index is 24 kg/m. General: Well developed, well nourished, WF. Appears in no acute  distress.  Neck: Supple. Trachea midline. No thyromegaly. Full ROM. No lymphadenopathy.No Carotid Bruits. Lungs: Clear to auscultation bilaterally without wheezes, rales, or rhonchi. Breathing is of normal effort and unlabored. Cardiovascular: RRR with S1 S2. No murmurs, rubs, or gallops.  No carotid bruits.No LE Edema.  Musculoskeletal: Full range of motion and 5/5 strength throughout. Skin: Warm and moist.  No LE Edema. Neuro: A+Ox3. CN II-XII grossly intact. Moves all extremities spontaneously. Full sensation throughout. Normal gait. Psych:  Responds to questions appropriately with a normal affect.   Assessment/Plan:  71 y.o. y/o female here for    Essential hypertension 10/12/2016: Blood pressure controlled and at goal. Continue current medications. Check lab to monitor.  H/O Hyperlipidemia 10/12/2016: Lipids were good 02/18/14. LDL 113. Does not require treatment.   Osteopenia 10/12/2016: She had DEXA scan 04/26/16. T score -2.3. Osteopenia. Findings were similar to the last prior DEXA scan therefore we had her just continue calcium vitamin D and weightbearing exercise. At OV 10/12/16 further discuss these findings/results with patient. Discussed that T score is close to being in the osteoporosis category. Therefore if T score worsens on future bone density test then may require prescription medication. Today she states that her husband has been trying to get her to join the gym and if she adds this weightbearing exercise this would help. He is currently taking calcium and vitamin D as directed.  Vitamin D deficiency I verified with patient and she says she is taking the vitamin D at the dose listed on her medicine list. - Vit D level normal 02/18/2014 on current dose vitamin D. Recheck Vitamin D Level 04/08/15---level was 47---this was stable from the check 6 months prior--at which time Vit D level was 44. 04/13/2016: Vitamin D levels have been stable the last 2 checks over the last 2  years. Will not recheck level today. Continue current dose of vitamin D. 10/12/2016: Last vitamin D level was checked 04/08/15. We'll recheck vitamin D level today.  THE FOLLOWING IS COPIED FROM HER CPE ON 02/18/2014:  CPE 1. Visit for preventive health examination   A. Screening Labs:   --------------------------------------------------  ALL OF THESE RESULTS WERE NORMAL------------------- She IS fasting.  - CBC with Differential - COMPLETE METABOLIC PANEL WITH GFR - Lipid panel - TSH - Vit D  25 hydroxy (rtn osteoporosis monitoring)  B. Pap: - PAP, Thin Prep w/HPV rflx HPV Type 16/18-------------------------------NORMAL----------------------------------   C. Screening Mammogram: Prior to loss of  insurance, she had mammograms performed routinely at [facility]. She says that she did go have another mammogram performed at [facility] in either June or July of this year (2015)-- (once she had insurance again) states that this was negative. At OV 10/07/14 she states that she just got a letter that she is due to schedule follow-up mammogram and she plans to go home and do this. 10/28/2014: Addendum added: Received Mammogram report from [facility]. Mammogram performed 10/23/2014. Negative.  At OV 10/11/15---Discussed that mammo due soon---she says she has received a letter and will f/u with scheduling this At OV 04/13/2016: Reviewed that she did have mammogram 11/18/2015  D. DEXA/BMD:  At CPE 11/15 I scheduled her for DEXA scan follow-up. She had DEXA scan 03/05/14. --- Lumbar spine was normal at -0.2. Femur was -2.4 consistent with osteopenia. I compared these findings to her prior DEXA scan which had been 01/05/2010. At that time her worst site was her femur at -2.3. At that time I had reviewed her risk factors for osteoporosis and she only had one positive is being Caucasian. Were negative for smoking, frail, steroid-induced. Also negative for fracture. At that time had recommended calcium vitamin D and  weightbearing exercise. Repeat 2 years.  Again at the time of her current DEXA 03/05/14 told her to continue calcium vitamin D and weightbearing exercise and repeat 2 years. Vitamin D level was within normal limits on 02/18/14 on current dose of vitamin D.  At OV 10/11/15---discussed that DEXA will be due 02/2016---she says she goes to 2 different places for her mammo and another place for DEXA--she wants to cont to go to 2 separate places, 2 separate times.  04/13/2016: She has not had follow-up DEXA scan. Thinks that we have to place an order in order for her to go. Today I have placed order for her to have DEXA scan at the same facility that she had her last one.   10/12/2016: She had DEXA scan 04/26/16. T score -2.3. Osteopenia. Findings were similar to the last prior DEXA scan therefore we had her just continue calcium vitamin D and weightbearing exercise. At OV 10/12/16 further discuss these findings/results with patient. Discussed that T score is close to being in the osteoporosis category. Therefore if T score worsens on future bone density test then may require prescription medication. Today she states that her husband has been trying to get her to join the gym and if she adds this weightbearing exercise this would help. He is currently taking calcium and vitamin D as directed.  E. Colorectal Cancer Screening: She underwent colonoscopy with Dr. Kendell Bane 03/16/2014. Diverticulosis otherwise normal. Repeat 10 years.  F. Immunizations:  Influenza:   Received this here 02/2014, 04/08/2015, 04/13/2016 Tetanus: She received Tdap here 06/18/2007 Pneumococcal:  Prevnar 13 --Given here 02/2014.  Pneumovax 23--Given here 10/07/2014 Zostavax:  She received this 10/10/2014    Follow-up office visit in 6 months or sooner if needed.   Murray Hodgkins Brady, [place], [facility] 10/12/2016 8:13 AM

## 2016-10-13 LAB — BASIC METABOLIC PANEL WITH GFR
BUN: 15 mg/dL (ref 7–25)
CO2: 25 mmol/L (ref 20–31)
Calcium: 9.4 mg/dL (ref 8.6–10.4)
Chloride: 104 mmol/L (ref 98–110)
Creat: 0.68 mg/dL (ref 0.50–0.99)
Glucose, Bld: 91 mg/dL (ref 70–99)
POTASSIUM: 4 mmol/L (ref 3.5–5.3)
Sodium: 141 mmol/L (ref 135–146)

## 2016-10-13 LAB — VITAMIN D 25 HYDROXY (VIT D DEFICIENCY, FRACTURES): Vit D, 25-Hydroxy: 52 ng/mL (ref 30–100)

## 2016-10-26 DIAGNOSIS — H25013 Cortical age-related cataract, bilateral: Secondary | ICD-10-CM | POA: Diagnosis not present

## 2016-10-26 DIAGNOSIS — H5200 Hypermetropia, unspecified eye: Secondary | ICD-10-CM | POA: Diagnosis not present

## 2016-10-26 DIAGNOSIS — Z0100 Encounter for examination of eyes and vision without abnormal findings: Secondary | ICD-10-CM | POA: Diagnosis not present

## 2016-11-02 DIAGNOSIS — R69 Illness, unspecified: Secondary | ICD-10-CM | POA: Diagnosis not present

## 2016-12-12 DIAGNOSIS — Z1231 Encounter for screening mammogram for malignant neoplasm of breast: Secondary | ICD-10-CM | POA: Diagnosis not present

## 2016-12-12 LAB — HM MAMMOGRAPHY

## 2016-12-20 ENCOUNTER — Encounter: Payer: Self-pay | Admitting: Family Medicine

## 2017-03-13 DIAGNOSIS — R69 Illness, unspecified: Secondary | ICD-10-CM | POA: Diagnosis not present

## 2017-04-23 ENCOUNTER — Ambulatory Visit (INDEPENDENT_AMBULATORY_CARE_PROVIDER_SITE_OTHER): Payer: Medicare HMO | Admitting: Physician Assistant

## 2017-04-23 ENCOUNTER — Other Ambulatory Visit: Payer: Self-pay

## 2017-04-23 ENCOUNTER — Encounter: Payer: Self-pay | Admitting: Physician Assistant

## 2017-04-23 VITALS — BP 138/74 | HR 93 | Temp 97.8°F | Resp 18 | Wt 141.8 lb

## 2017-04-23 DIAGNOSIS — Z1159 Encounter for screening for other viral diseases: Secondary | ICD-10-CM

## 2017-04-23 DIAGNOSIS — M8580 Other specified disorders of bone density and structure, unspecified site: Secondary | ICD-10-CM

## 2017-04-23 DIAGNOSIS — E559 Vitamin D deficiency, unspecified: Secondary | ICD-10-CM | POA: Diagnosis not present

## 2017-04-23 DIAGNOSIS — I10 Essential (primary) hypertension: Secondary | ICD-10-CM | POA: Diagnosis not present

## 2017-04-23 MED ORDER — BENAZEPRIL HCL 20 MG PO TABS: 20.0000 mg | ORAL_TABLET | Freq: Every day | ORAL | 3 refills | Status: DC

## 2017-04-23 MED ORDER — AMLODIPINE BESYLATE 10 MG PO TABS: 10.0000 mg | ORAL_TABLET | Freq: Every day | ORAL | 3 refills | Status: DC

## 2017-04-23 NOTE — Progress Notes (Signed)
Patient ID: Sarah Williams MRN: [id number], DOB: 11-Apr-1946, 72 y.o. Date of Encounter: 04/23/2017,   Chief Complaint: F/U HTN  HPI: 72 y.o. y/o white female here to f/u HTN.    She had office visit with me on 02/18/2014 for CPE and also as a "New Patient".  The following is copied from that OV Note 02/18/2014:  Actually, she had been a patient here in the past but says that she then lost her insurance and had no insurance for a couple of years and that is why she has not been here recently.  Last office visit here was 12/15/2010. Last complete physical exam here was with me on 11/29/2009.  She says that 2 years ago her husband lost his job and they lost insurance at that time. Therefore she has had no medical care and has not been on any prescription medications during that time. Luckily, she says that she had no illnesses or medical problems during that period of time. Therefore, no new medical information to update compared to what was already in her paper chart from the past.  Unfortunately, because she had no insurance coverage, she has been off of her Norvasc 10 mg and benazepril 20mg ,  which he been prescribed prior to the insurance loss. Therefore today we are getting her blood pressure readings significantly elevated. Even with this, she still is reporting no angina symptoms and no TIA/CVA type symptoms and no chest pain or SOB.  At that visit on 02/18/14 I prescribed Norvasc 5 mg--told her to take 1 daily for 1 week then increase to 2 daily.                                      --Also prescribed benazepril 10 mg.--Told her to take 1 daily for 1 week then increase to 2 daily.   OV ----03/19/2014--- she says that she has been taking these medications as directed and as listed above. She says that she bought a new blood pressure cuff about 6 months ago that was expensive so she thinks that should be accurate. Says that she has been checking her blood pressure 2 or 3 times per  day. Says she is getting very different readings at different times but on average is around 128/130 over 70s. She has noticed no adverse effects from the medications.   OV 10/07/2014: She has no complaints today. She continues to take both blood pressure medications as directed with no adverse effects. Checks her blood pressure occasionally at home and is getting good readings.  OV 04/08/15: She has no complaints today. She continues to take both blood pressure medications as directed with no adverse effects. Checks her blood pressure occasionally at home and is good getting good readings.  10/11/2015: She is taking both BP meds as directed. No adverse effects. No lightheadedness. No lower extremity edema.  She is taking Vit D at dose directed in past.  No complaints or concerns today.   04/13/2016: She is taking blood pressure medications as directed. She is having no lightheadedness. No lower extremity edema. No other adverse effects. She continues to occasionally check her blood pressure at home and continues to get good readings. She is continuing to take the vitamin D at the dose directed in the past which is on her med list. She has no complaints or concerns today. She has not had the flu shot yet and  does want to get the flu vaccine today.  10/12/2016: Today I reviewed with her the findings on her DEXA scan from 04/26/16--- see assessment plan section below She continues to take her blood pressure medications as directed. Continues to have no adverse effects. No lightheadedness. No extremity edema. Says that her blood pressure is a little higher today because her husband was running late getting here for their appointment this morning. She continues to check blood pressure occasionally at home and continues to get good readings. She is taking the calcium and vitamin D as directed. Says that her husband is trying to get her to join the gym and knows that the weightbearing exercise would  help regarding her bones. She has no concerns to address today. Says that she has been feeling well.   04/23/2017: Continues to take both of her blood pressure medications daily.  Having no lightheadedness or other adverse effects. She continues to take her calcium and vitamin D.  Is that she did not start going to the gym with her husband.  States that he gets up and goes to early in the morning for her.  However says that she might start doing silver sneakers with her sister. Reports that she "got a cold "on Christmas eve.  Says that it is resolving and she does not want any antibiotics.  However she does sound a little congested when she talks.  I told her that if this does not resolve this week definitely to call and I will prescribe antibiotic.  Try to get her to go ahead and do an antibiotic today.  Discussed that generally viral infection should be resolved by now and that this may be bacterial which would require an antibiotic.  However she insists that it is almost resolved.   Review of Systems: Consitutional: No fever, chills, fatigue, night sweats, lymphadenopathy. No significant/unexplained weight changes. Eyes: No visual changes, eye redness, or discharge. ENT/Mouth: No ear pain, sore throat, nasal drainage, or sinus pain. Cardiovascular: No chest pressure,heaviness, tightness or squeezing, even with exertion. No increased shortness of breath or dyspnea on exertion.No palpitations, edema, orthopnea, PND. Respiratory: No cough, hemoptysis, SOB, or wheezing. Gastrointestinal: No anorexia, dysphagia, reflux, pain, nausea, vomiting, hematemesis, diarrhea, constipation, BRBPR, or melena. Breast: No mass, nodules, bulging, or retraction. No skin changes or inflammation. No nipple discharge. No lymphadenopathy. Genitourinary: No dysuria, hematuria, incontinence, vaginal discharge, pruritis, burning, abnormal bleeding, or pain. Musculoskeletal: No decreased ROM, No joint pain or swelling. No  significant pain in neck, back, or extremities. Skin: No rash, pruritis, or concerning lesions. Neurological: No headache, dizziness, syncope, seizures, tremors, memory loss, coordination problems, or paresthesias. Psychological: No anxiety, depression, hallucinations, SI/HI. Endocrine: No polydipsia, polyphagia, polyuria, or known diabetes.No increased fatigue. No palpitations/rapid heart rate. No significant/unexplained weight change. All other systems were reviewed and are otherwise negative.  Past Medical History:  Diagnosis Date  . Hyperlipidemia    11/2010  . Hypertension   . Osteoporosis   . PONV (postoperative nausea and vomiting)      Past Surgical History:  Procedure Laterality Date  . BREAST SURGERY     Benign--Bilateral Lumpectomies--Benign  . COLONOSCOPY N/A 03/16/2014   Procedure: COLONOSCOPY;  Surgeon: Corbin Ade, MD;  Location: [facility];  Service: Endoscopy;  Laterality: N/A;  2:15 PM  . COSMETIC SURGERY  03/08   blepharoplasty    Home Meds:  Outpatient Medications Prior to Visit  Medication Sig Dispense Refill  . amLODipine (NORVASC) 10 MG tablet TAKE ONE  TABLET BY MOUTH ONCE DAILY 90 tablet 3  . benazepril (LOTENSIN) 20 MG tablet TAKE ONE TABLET BY MOUTH ONCE DAILY 90 tablet 3  . Cholecalciferol (VITAMIN D) 2000 UNITS tablet Take 2,000 Units by mouth every evening.    . Multiple Vitamin (MULTIVITAMIN) tablet Take 1 tablet by mouth every evening. Centrum Silver     No facility-administered medications prior to visit.     Allergies: No Known Allergies  Social History   Socioeconomic History  . Marital status: Married    Spouse name: Not on file  . Number of children: Not on file  . Years of education: Not on file  . Highest education level: Not on file  Social Needs  . Financial resource strain: Not on file  . Food insecurity - worry: Not on file  . Food insecurity - inability: Not on file  . Transportation needs - medical: Not on file  .  Transportation needs - non-medical: Not on file  Occupational History  . Not on file  Tobacco Use  . Smoking status: Never Smoker  . Smokeless tobacco: Never Used  Substance and Sexual Activity  . Alcohol use: Yes    Alcohol/week: 3.0 oz    Types: 5 Glasses of wine per week  . Drug use: No  . Sexual activity: Yes    Birth control/protection: Post-menopausal  Other Topics Concern  . Not on file  Social History Narrative   Entered 02/2014:   Married.   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   Works about 16 hours a week now.   Says she "has to stay busy"--in summer works in yard, garden.    Always working on a project in the house etc.     Family History  Problem Relation Age of Onset  . Hypertension Father   . Kidney disease Father   . Stroke Father   . Heart disease Mother 80       CAD  . Heart disease Paternal Grandfather 66       CAD, MI    Physical Exam: Blood pressure 138/74, pulse 93, temperature 97.8 F (36.6 C), temperature source Oral, resp. rate 18, weight 64.3 kg (141 lb 12.8 oz), SpO2 98 %., Body mass index is 24.34 kg/m. General: Well developed, well nourished, WF. Appears in no acute distress. HEENT: She sounds congested when she talks. Ear exam normal. Pharynx exam normal. Neck: Supple. Trachea midline. No thyromegaly. Full ROM. No lymphadenopathy.No Carotid Bruits. Lungs: Clear to auscultation bilaterally without wheezes, rales, or rhonchi. Breathing is of normal effort and unlabored. Cardiovascular: RRR with S1 S2. No murmurs, rubs, or gallops.  No carotid bruits.No LE Edema.  Musculoskeletal: Full range of motion and 5/5 strength throughout. Skin: Warm and moist.  No LE Edema. Neuro: A+Ox3. CN II-XII grossly intact. Moves all extremities spontaneously. Full sensation throughout. Normal gait. Psych:  Responds to questions appropriately with a normal  affect.   Assessment/Plan:  72 y.o. y/o female here for   Reports that she "got a cold "on Christmas eve.  Says that it is resolving and she does not want any antibiotics.  However she does sound a little congested when she talks.  I told her that if this does not resolve this week definitely to call and I will prescribe antibiotic.  Try to get her to go ahead and do an antibiotic today.  Discussed that generally  viral infection should be resolved by now and that this may be bacterial which would require an antibiotic.  However she insists that it is almost resolved.  Essential hypertension 04/23/2017: Blood pressure controlled and at goal. Continue current medications. Check lab to monitor.  H/O Hyperlipidemia 04/23/2017: Lipids were good 02/18/14. LDL 113. Does not require treatment.   Osteopenia 04/23/2017: : She had DEXA scan 04/26/16. T score -2.3. Osteopenia. Findings were similar to the last prior DEXA scan therefore we had her just continue calcium vitamin D and weightbearing exercise. At OV 10/12/16 further discuss these findings/results with patient. Discussed that T score is close to being in the osteoporosis category. Therefore if T score worsens on future bone density test then may require prescription medication. She never smoked.  She has not been on prednisone She has no personal history of fracture.  She is considering adding exercise. Weight bearing exercise would help. She is currently taking calcium and vitamin D as directed.  Vitamin D deficiency I verified with patient and she says she is taking the vitamin D at the dose listed on her medicine list. - Vit D level normal 02/18/2014 on current dose vitamin D. Recheck Vitamin D Level 04/08/15---level was 47---this was stable from the check 6 months prior--at which time Vit D level was 44. 04/13/2016: Vitamin D levels have been stable the last 2 checks over the last 2 years. Will not recheck level today. Continue current dose of  vitamin D. 10/12/2016: Last vitamin D level was checked 04/08/15. We'll recheck vitamin D level today. 04/23/2017: Vitamin D level 09/2016.  At that time vitamin D was 52.  Prior to that the level was 47.  Continue current dose.  Can wait to recheck lab level.   THE FOLLOWING IS COPIED FROM HER CPE ON 02/18/2014:  CPE 1. Visit for preventive health examination   A. Screening Labs:   --------------------------------------------------  ALL OF THESE RESULTS WERE NORMAL------------------- She IS fasting.  - CBC with Differential - COMPLETE METABOLIC PANEL WITH GFR - Lipid panel - TSH - Vit D  25 hydroxy (rtn osteoporosis monitoring)  B. Pap: - PAP, Thin Prep w/HPV rflx HPV Type 16/18-------------------------------NORMAL----------------------------------   C. Screening Mammogram: Prior to loss of insurance, she had mammograms performed routinely at [facility]. She says that she did go have another mammogram performed at [facility] in either June or July of this year (2015)-- (once she had insurance again) states that this was negative. At OV 10/07/14 she states that she just got a letter that she is due to schedule follow-up mammogram and she plans to go home and do this. 10/28/2014: Addendum added: Received Mammogram report from [facility]. Mammogram performed 10/23/2014. Negative.  At OV 10/11/15---Discussed that mammo due soon---she says she has received a letter and will f/u with scheduling this At OV 04/13/2016: Reviewed that she did have mammogram 11/18/2015  D. DEXA/BMD:  At CPE 11/15 I scheduled her for DEXA scan follow-up. She had DEXA scan 03/05/14. --- Lumbar spine was normal at -0.2. Femur was -2.4 consistent with osteopenia. I compared these findings to her prior DEXA scan which had been 01/05/2010. At that time her worst site was her femur at -2.3. At that time I had reviewed her risk factors for osteoporosis and she only had one positive is being Caucasian. Were negative for smoking, frail,  steroid-induced. Also negative for fracture. At that time had recommended calcium vitamin D and weightbearing exercise. Repeat 2 years.  Again at the time of her current  DEXA 03/05/14 told her to continue calcium vitamin D and weightbearing exercise and repeat 2 years. Vitamin D level was within normal limits on 02/18/14 on current dose of vitamin D.  At OV 10/11/15---discussed that DEXA will be due 02/2016---she says she goes to 2 different places for her mammo and another place for DEXA--she wants to cont to go to 2 separate places, 2 separate times.  04/13/2016: She has not had follow-up DEXA scan. Thinks that we have to place an order in order for her to go. Today I have placed order for her to have DEXA scan at the same facility that she had her last one.   10/12/2016: She had DEXA scan 04/26/16. T score -2.3. Osteopenia. Findings were similar to the last prior DEXA scan therefore we had her just continue calcium vitamin D and weightbearing exercise. At OV 10/12/16 further discuss these findings/results with patient. Discussed that T score is close to being in the osteoporosis category. Therefore if T score worsens on future bone density test then may require prescription medication. Today she states that her husband has been trying to get her to join the gym and if she adds this weightbearing exercise this would help. He is currently taking calcium and vitamin D as directed.  E. Colorectal Cancer Screening: She underwent colonoscopy with Dr. Kendell Baneourke 03/16/2014. Diverticulosis otherwise normal. Repeat 10 years.  F. Immunizations:  Influenza:   Received this here 02/2014, 04/08/2015, 04/13/2016, 2018 Tetanus: She received Tdap here 06/18/2007 Pneumococcal:  Prevnar 13 --Given here 02/2014.  Pneumovax 23--Given here 10/07/2014 Zostavax:  She received this 10/10/2014    Follow-up office visit in 6 months or sooner if needed.   Murray HodgkinsSigned, Mary Beth Maple GlenDixon, [place], [facility] 04/23/2017 8:18 AM

## 2017-04-24 LAB — BASIC METABOLIC PANEL WITH GFR
BUN: 16 mg/dL (ref 7–25)
CO2: 27 mmol/L (ref 20–32)
CREATININE: 0.75 mg/dL (ref 0.50–0.99)
Calcium: 9.6 mg/dL (ref 8.6–10.4)
Chloride: 104 mmol/L (ref 98–110)
GFR, EST AFRICAN AMERICAN: 94 mL/min/{1.73_m2} (ref >=60)
GFR, EST NON AFRICAN AMERICAN: 81 mL/min/{1.73_m2} (ref >=60)
Glucose, Bld: 103 mg/dL — ABNORMAL HIGH (ref 65–99)
Potassium: 4.9 mmol/L (ref 3.5–5.3)
Sodium: 142 mmol/L (ref 135–146)

## 2017-04-24 LAB — HEPATITIS C ANTIBODY
HEP C AB: NONREACTIVE
SIGNAL TO CUT-OFF: 0.01 (ref <1.00)

## 2017-04-24 LAB — EXTRA LAV TOP TUBE

## 2017-05-14 DIAGNOSIS — R69 Illness, unspecified: Secondary | ICD-10-CM | POA: Diagnosis not present

## 2017-06-26 DIAGNOSIS — Z823 Family history of stroke: Secondary | ICD-10-CM | POA: Diagnosis not present

## 2017-06-26 DIAGNOSIS — I10 Essential (primary) hypertension: Secondary | ICD-10-CM | POA: Diagnosis not present

## 2017-06-26 DIAGNOSIS — Z8249 Family history of ischemic heart disease and other diseases of the circulatory system: Secondary | ICD-10-CM | POA: Diagnosis not present

## 2017-10-22 ENCOUNTER — Ambulatory Visit (INDEPENDENT_AMBULATORY_CARE_PROVIDER_SITE_OTHER): Payer: Medicare HMO | Admitting: Physician Assistant

## 2017-10-22 ENCOUNTER — Other Ambulatory Visit: Payer: Self-pay

## 2017-10-22 ENCOUNTER — Encounter: Payer: Self-pay | Admitting: Physician Assistant

## 2017-10-22 VITALS — BP 130/68 | HR 63 | Temp 97.5°F | Resp 14 | Ht 64.0 in | Wt 145.0 lb

## 2017-10-22 DIAGNOSIS — I10 Essential (primary) hypertension: Secondary | ICD-10-CM | POA: Diagnosis not present

## 2017-10-22 NOTE — Progress Notes (Signed)
Patient ID: Sarah Williams MRN: [id number], DOB: [birth year], 72 y.o. Date of Encounter: 10/22/2017,   Chief Complaint: F/U HTN  HPI: 72 y.o. y/o white female here to f/u HTN.    She had office visit with me on 02/18/2014 for CPE and also as a "New Patient".  The following is copied from that OV Note 02/18/2014:  Actually, she had been a patient here in the past but says that she then lost her insurance and had no insurance for a couple of years and that is why she has not been here recently.  Last office visit here was 12/15/2010. Last complete physical exam here was with me on 11/29/2009.  She says that 2 years ago her husband lost his job and they lost insurance at that time. Therefore she has had no medical care and has not been on any prescription medications during that time. Luckily, she says that she had no illnesses or medical problems during that period of time. Therefore, no new medical information to update compared to what was already in her paper chart from the past.  Unfortunately, because she had no insurance coverage, she has been off of her Norvasc 10 mg and benazepril 20mg ,  which he been prescribed prior to the insurance loss. Therefore today we are getting her blood pressure readings significantly elevated. Even with this, she still is reporting no angina symptoms and no TIA/CVA type symptoms and no chest pain or SOB.  At that visit on 02/18/14 I prescribed Norvasc 5 mg--told her to take 1 daily for 1 week then increase to 2 daily.                                      --Also prescribed benazepril 10 mg.--Told her to take 1 daily for 1 week then increase to 2 daily.   OV ----03/19/2014--- she says that she has been taking these medications as directed and as listed above. She says that she bought a new blood pressure cuff about 6 months ago that was expensive so she thinks that should be accurate. Says that she has been checking her blood pressure 2 or 3 times per  day. Says she is getting very different readings at different times but on average is around 128/130 over 70s. She has noticed no adverse effects from the medications.   OV 10/07/2014: She has no complaints today. She continues to take both blood pressure medications as directed with no adverse effects. Checks her blood pressure occasionally at home and is getting good readings.  OV 04/08/15: She has no complaints today. She continues to take both blood pressure medications as directed with no adverse effects. Checks her blood pressure occasionally at home and is good getting good readings.  10/11/2015: She is taking both BP meds as directed. No adverse effects. No lightheadedness. No lower extremity edema.  She is taking Vit D at dose directed in past.  No complaints or concerns today.   04/13/2016: She is taking blood pressure medications as directed. She is having no lightheadedness. No lower extremity edema. No other adverse effects. She continues to occasionally check her blood pressure at home and continues to get good readings. She is continuing to take the vitamin D at the dose directed in the past which is on her med list. She has no complaints or concerns today. She has not had the flu shot yet and  does want to get the flu vaccine today.  10/12/2016: Today I reviewed with her the findings on her DEXA scan from 04/26/16--- see assessment plan section below She continues to take her blood pressure medications as directed. Continues to have no adverse effects. No lightheadedness. No extremity edema. Says that her blood pressure is a little higher today because her husband was running late getting here for their appointment this morning. She continues to check blood pressure occasionally at home and continues to get good readings. She is taking the calcium and vitamin D as directed. Says that her husband is trying to get her to join the gym and knows that the weightbearing exercise would  help regarding her bones. She has no concerns to address today. Says that she has been feeling well.   04/23/2017: Continues to take both of her blood pressure medications daily.  Having no lightheadedness or other adverse effects. She continues to take her calcium and vitamin D.  Is that she did not start going to the gym with her husband.  States that he gets up and goes to early in the morning for her.  However says that she might start doing silver sneakers with her sister. Reports that she "got a cold "on Christmas eve.  Says that it is resolving and she does not want any antibiotics.  However she does sound a little congested when she talks.  I told her that if this does not resolve this week definitely to call and I will prescribe antibiotic.  Try to get her to go ahead and do an antibiotic today.  Discussed that generally viral infection should be resolved by now and that this may be bacterial which would require an antibiotic.  However she insists that it is almost resolved.   10/22/2017: She continues to take both of her blood pressure medications daily.  Having no lightheadedness or other adverse effects. She continues to take her calcium and vitamin D.   I also see her husband as a patient.  He has started going to the gym in the last several years and now is going 5 days a week.  Goes early in the morning.   At all of her visits--  since he has started going to the gym-- patient states that she still has not started going with him to the gym.  Says that it is too early in the morning for her.   She also states that she "is not one to sit still" and is constantly doing yard work etc.  "That is her exercise. " Says that she has no problem lifting 50 pound bag of topsoil.  Says that she has a large yard to take care of  and a lot of flowers as well as a vegetable garden. She has no specific concerns to address today.  Everything has been stable from a medical/health standpoint.     Review  of Systems: Consitutional: No fever, chills, fatigue, night sweats, lymphadenopathy. No significant/unexplained weight changes. Eyes: No visual changes, eye redness, or discharge. ENT/Mouth: No ear pain, sore throat, nasal drainage, or sinus pain. Cardiovascular: No chest pressure,heaviness, tightness or squeezing, even with exertion. No increased shortness of breath or dyspnea on exertion.No palpitations, edema, orthopnea, PND. Respiratory: No cough, hemoptysis, SOB, or wheezing. Gastrointestinal: No anorexia, dysphagia, reflux, pain, nausea, vomiting, hematemesis, diarrhea, constipation, BRBPR, or melena. Breast: No mass, nodules, bulging, or retraction. No skin changes or inflammation. No nipple discharge. No lymphadenopathy. Genitourinary: No dysuria, hematuria, incontinence, vaginal  discharge, pruritis, burning, abnormal bleeding, or pain. Musculoskeletal: No decreased ROM, No joint pain or swelling. No significant pain in neck, back, or extremities. Skin: No rash, pruritis, or concerning lesions. Neurological: No headache, dizziness, syncope, seizures, tremors, memory loss, coordination problems, or paresthesias. Psychological: No anxiety, depression, hallucinations, SI/HI. Endocrine: No polydipsia, polyphagia, polyuria, or known diabetes.No increased fatigue. No palpitations/rapid heart rate. No significant/unexplained weight change. All other systems were reviewed and are otherwise negative.  Past Medical History:  Diagnosis Date  . Hyperlipidemia    11/2010  . Hypertension   . Osteoporosis   . PONV (postoperative nausea and vomiting)      Past Surgical History:  Procedure Laterality Date  . BREAST SURGERY     Benign--Bilateral Lumpectomies--Benign  . COLONOSCOPY N/A 03/16/2014   Procedure: COLONOSCOPY;  Surgeon: Corbin Adeobert M Rourk, MD;  Location: [facility];  Service: Endoscopy;  Laterality: N/A;  2:15 PM  . COSMETIC SURGERY  03/08   blepharoplasty    Home Meds:   Outpatient Medications Prior to Visit  Medication Sig Dispense Refill  . amLODipine (NORVASC) 10 MG tablet Take 1 tablet (10 mg total) by mouth daily. 90 tablet 3  . benazepril (LOTENSIN) 20 MG tablet Take 1 tablet (20 mg total) by mouth daily. 90 tablet 3  . Cholecalciferol (VITAMIN D) 2000 UNITS tablet Take 2,000 Units by mouth every evening.    . Multiple Vitamin (MULTIVITAMIN) tablet Take 1 tablet by mouth every evening. Centrum Silver     No facility-administered medications prior to visit.     Allergies: No Known Allergies  Social History   Socioeconomic History  . Marital status: Married    Spouse name: Not on file  . Number of children: Not on file  . Years of education: Not on file  . Highest education level: Not on file  Occupational History  . Not on file  Social Needs  . Financial resource strain: Not on file  . Food insecurity:    Worry: Not on file    Inability: Not on file  . Transportation needs:    Medical: Not on file    Non-medical: Not on file  Tobacco Use  . Smoking status: Never Smoker  . Smokeless tobacco: Never Used  Substance and Sexual Activity  . Alcohol use: Yes    Alcohol/week: 3.0 oz    Types: 5 Glasses of wine per week  . Drug use: No  . Sexual activity: Yes    Birth control/protection: Post-menopausal  Lifestyle  . Physical activity:    Days per week: Not on file    Minutes per session: Not on file  . Stress: Not on file  Relationships  . Social connections:    Talks on phone: Not on file    Gets together: Not on file    Attends religious service: Not on file    Active member of club or organization: Not on file    Attends meetings of clubs or organizations: Not on file    Relationship status: Not on file  . Intimate partner violence:    Fear of current or ex partner: Not on file    Emotionally abused: Not on file    Physically abused: Not on file    Forced sexual activity: Not on file  Other Topics Concern  . Not on file   Social History Narrative   Entered 02/2014:   Married.   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   Works about 16 hours a week now.   Says she "has to stay busy"--in summer works in yard, garden.    Always working on a project in the house etc.     Family History  Problem Relation Age of Onset  . Hypertension Father   . Kidney disease Father   . Stroke Father   . Heart disease Mother 28       CAD  . Heart disease Paternal Grandfather 18       CAD, MI    Physical Exam: Blood pressure 130/68, pulse 63, temperature (!) 97.5 F (36.4 C), temperature source Oral, resp. rate 14, height 5\' 4"  (1.626 m), weight 65.8 kg (145 lb), SpO2 99 %., Body mass index is 24.89 kg/m. General: WNWD WF. Appears in no acute distress. Neck: Supple. No thyromegaly. No lymphadenopathy. No carotid bruits. Lungs: Clear bilaterally to auscultation without wheezes, rales, or rhonchi. Breathing is unlabored. Heart: RRR with S1 S2. No murmurs, rubs, or gallops. Musculoskeletal:  Strength and tone normal for age. Extremities/Skin: Warm and dry.  No LE edema.  Neuro: Alert and oriented X 3. Moves all extremities spontaneously. Gait is normal. CNII-XII grossly in tact. Psych:  Responds to questions appropriately with a normal affect.    Assessment/Plan:  72 y.o. y/o female here for    Essential hypertension 10/22/2017: Blood pressure controlled and at goal. Continue current medications. Check lab to monitor.  H/O Hyperlipidemia 10/22/2017: Lipids were good 02/18/14. LDL 113. Does not require treatment.   Osteopenia 04/23/2017: : She had DEXA scan 04/26/16. T score -2.3. Osteopenia. Findings were similar to the last prior DEXA scan therefore we had her just continue calcium vitamin D and weightbearing exercise. At OV 10/12/16 further discuss these findings/results with patient. Discussed that T score is close to  being in the osteoporosis category. Therefore if T score worsens on future bone density test then may require prescription medication. She never smoked.  She has not been on prednisone She has no personal history of fracture.  She is considering adding exercise. Weight bearing exercise would help. She is currently taking calcium and vitamin D as directed. 10/22/2017: She is taking calcium and vitamin D.  Will repeat DEXA scan 04/26/2018.  Vitamin D deficiency I verified with patient and she says she is taking the vitamin D at the dose listed on her medicine list. - Vit D level normal 02/18/2014 on current dose vitamin D. Recheck Vitamin D Level 04/08/15---level was 47---this was stable from the check 6 months prior--at which time Vit D level was 44. 04/13/2016: Vitamin D levels have been stable the last 2 checks over the last 2 years. Will not recheck level today. Continue current dose of vitamin D. 10/12/2016: Last vitamin D level was checked 04/08/15. We'll recheck vitamin D level today. 04/23/2017: Vitamin D level 09/2016.  At that time vitamin D was 52.  Prior to that the level was 47.  Continue current dose.  Can wait to recheck lab level.   THE FOLLOWING IS COPIED FROM HER CPE ON 02/18/2014:  CPE 1. Visit for preventive health examination   A. Screening Labs:   --------------------------------------------------  ALL OF THESE RESULTS WERE NORMAL------------------- She IS fasting.  - CBC with Differential - COMPLETE METABOLIC PANEL WITH GFR - Lipid panel - TSH - Vit D  25 hydroxy (rtn osteoporosis monitoring)  B. Pap: - PAP, Thin Prep w/HPV rflx HPV Type 16/18-------------------------------NORMAL----------------------------------  C. Screening Mammogram: Prior to loss of insurance, she had mammograms performed routinely at [facility]. She says that she did go have another mammogram performed at [facility] in either June or July of this year (2015)-- (once she had insurance again) states  that this was negative. At OV 10/07/14 she states that she just got a letter that she is due to schedule follow-up mammogram and she plans to go home and do this. 10/28/2014: Addendum added: Received Mammogram report from [facility]. Mammogram performed 10/23/2014. Negative.  At OV 10/11/15---Discussed that mammo due soon---she says she has received a letter and will f/u with scheduling this At OV 04/13/2016: Reviewed that she did have mammogram 11/18/2015 10/22/2017: Last mammogram was 12/12/2016.  D. DEXA/BMD:  At CPE 11/15 I scheduled her for DEXA scan follow-up. She had DEXA scan 03/05/14. --- Lumbar spine was normal at -0.2. Femur was -2.4 consistent with osteopenia. I compared these findings to her prior DEXA scan which had been 01/05/2010. At that time her worst site was her femur at -2.3. At that time I had reviewed her risk factors for osteoporosis and she only had one positive is being Caucasian. Were negative for smoking, frail, steroid-induced. Also negative for fracture. At that time had recommended calcium vitamin D and weightbearing exercise. Repeat 2 years.  Again at the time of her current DEXA 03/05/14 told her to continue calcium vitamin D and weightbearing exercise and repeat 2 years. Vitamin D level was within normal limits on 02/18/14 on current dose of vitamin D.  At OV 10/11/15---discussed that DEXA will be due 02/2016---she says she goes to 2 different places for her mammo and another place for DEXA--she wants to cont to go to 2 separate places, 2 separate times.  04/13/2016: She has not had follow-up DEXA scan. Thinks that we have to place an order in order for her to go. Today I have placed order for her to have DEXA scan at the same facility that she had her last one.   10/12/2016: She had DEXA scan 04/26/16. T score -2.3. Osteopenia. Findings were similar to the last prior DEXA scan therefore we had her just continue calcium vitamin D and weightbearing exercise. At OV 10/12/16  further discuss these findings/results with patient. Discussed that T score is close to being in the osteoporosis category. Therefore if T score worsens on future bone density test then may require prescription medication. Today she states that her husband has been trying to get her to join the gym and if she adds this weightbearing exercise this would help. He is currently taking calcium and vitamin D as directed. 10/22/2017: She is on calcium and vitamin D.  Repeat DEXA scan 04/26/2018.  E. Colorectal Cancer Screening: She underwent colonoscopy with Dr. Kendell Bane 03/16/2014. Diverticulosis otherwise normal. Repeat 10 years.  F. Immunizations:  Influenza:   Received this here 02/2014, 04/08/2015, 04/13/2016, 2018 Tetanus: She received Tdap here 06/18/2007 Pneumococcal:  Prevnar 13 --Given here 02/2014.  Pneumovax 23--Given here 10/07/2014 Zostavax:  She received this 10/10/2014    Follow-up office visit in 6 months or sooner if needed.   Signed, [address] [place], [place], B[facility] 10/22/2017 8:21 AM

## 2017-10-23 LAB — BASIC METABOLIC PANEL WITH GFR
BUN: 17 mg/dL (ref 7–25)
CHLORIDE: 105 mmol/L (ref 98–110)
CO2: 28 mmol/L (ref 20–32)
Calcium: 9.2 mg/dL (ref 8.6–10.4)
Creat: 0.77 mg/dL (ref 0.60–0.93)
GFR, EST AFRICAN AMERICAN: 91 mL/min/{1.73_m2} (ref >=60)
GFR, Est Non African American: 78 mL/min/{1.73_m2} (ref >=60)
Glucose, Bld: 95 mg/dL (ref 65–99)
Potassium: 4.5 mmol/L (ref 3.5–5.3)
Sodium: 142 mmol/L (ref 135–146)

## 2017-10-23 LAB — SPECIMEN COMPROMISED

## 2017-10-23 LAB — EXTRA LAV TOP TUBE

## 2017-11-12 DIAGNOSIS — R69 Illness, unspecified: Secondary | ICD-10-CM | POA: Diagnosis not present

## 2018-01-01 DIAGNOSIS — Z1231 Encounter for screening mammogram for malignant neoplasm of breast: Secondary | ICD-10-CM | POA: Diagnosis not present

## 2018-01-01 LAB — HM MAMMOGRAPHY

## 2018-01-08 NOTE — Progress Notes (Signed)
Mammogram results abstracted  

## 2018-02-13 DIAGNOSIS — R69 Illness, unspecified: Secondary | ICD-10-CM | POA: Diagnosis not present

## 2018-04-29 ENCOUNTER — Ambulatory Visit: Payer: Medicare HMO | Admitting: Physician Assistant

## 2018-04-29 ENCOUNTER — Encounter: Payer: Self-pay | Admitting: Family Medicine

## 2018-04-29 ENCOUNTER — Ambulatory Visit (INDEPENDENT_AMBULATORY_CARE_PROVIDER_SITE_OTHER): Payer: Medicare HMO | Admitting: Family Medicine

## 2018-04-29 VITALS — BP 132/78 | HR 68 | Temp 97.6°F | Resp 14 | Ht 64.0 in | Wt 150.0 lb

## 2018-04-29 DIAGNOSIS — M8580 Other specified disorders of bone density and structure, unspecified site: Secondary | ICD-10-CM | POA: Diagnosis not present

## 2018-04-29 DIAGNOSIS — E559 Vitamin D deficiency, unspecified: Secondary | ICD-10-CM | POA: Diagnosis not present

## 2018-04-29 DIAGNOSIS — I10 Essential (primary) hypertension: Secondary | ICD-10-CM | POA: Diagnosis not present

## 2018-04-29 NOTE — Progress Notes (Signed)
Subjective:    Patient ID: Sarah Williams, female    DOB: 01/22/1946, 73 y.o.   MRN: [id number]  HPI Patient is a very pleasant 73 year old Caucasian female here today for a general checkup.  Past medical history is significant for hypertension.  However her blood pressure today is well controlled.  She denies any chest pain shortness of breath or dyspnea on exertion.  She exercises regularly.  She stays very physically active with her job.  Her last bone density test was in 2018 and was significant for borderline osteoporosis.  However she had a very bad experience on Fosamax and refuses to take this again.  She is taking vitamin D but she is not taking calcium due to concerns because her mother died from "calcium" in the carotid arteries.  She has no bruit today on exam.  I recommended for the patient to thousand 1200 mg a day of calcium in addition to her vitamin D given her borderline osteoporosis.  Mammogram is up-to-date.  Her last Pap smear was in 2015 however it was completely normal.  Past Medical History:  Diagnosis Date  . Hyperlipidemia    11/2010  . Hypertension   . Osteoporosis   . PONV (postoperative nausea and vomiting)    Past Surgical History:  Procedure Laterality Date  . BREAST SURGERY     Benign--Bilateral Lumpectomies--Benign  . COLONOSCOPY N/A 03/16/2014   Procedure: COLONOSCOPY;  Surgeon: Corbin Adeobert M Rourk, MD;  Location: [facility];  Service: Endoscopy;  Laterality: N/A;  2:15 PM  . COSMETIC SURGERY  03/08   blepharoplasty   Current Outpatient Medications on File Prior to Visit  Medication Sig Dispense Refill  . amLODipine (NORVASC) 10 MG tablet Take 1 tablet (10 mg total) by mouth daily. 90 tablet 3  . benazepril (LOTENSIN) 20 MG tablet Take 1 tablet (20 mg total) by mouth daily. 90 tablet 3  . Cholecalciferol (VITAMIN D) 2000 UNITS tablet Take 2,000 Units by mouth every evening.    . Multiple Vitamin (MULTIVITAMIN) tablet Take 1 tablet by mouth every  evening. Centrum Silver     No current facility-administered medications on file prior to visit.    No Known Allergies Social History   Socioeconomic History  . Marital status: Married    Spouse name: Not on file  . Number of children: Not on file  . Years of education: Not on file  . Highest education level: Not on file  Occupational History  . Not on file  Social Needs  . Financial resource strain: Not on file  . Food insecurity:    Worry: Not on file    Inability: Not on file  . Transportation needs:    Medical: Not on file    Non-medical: Not on file  Tobacco Use  . Smoking status: Never Smoker  . Smokeless tobacco: Never Used  Substance and Sexual Activity  . Alcohol use: Yes    Alcohol/week: 5.0 standard drinks    Types: 5 Glasses of wine per week  . Drug use: No  . Sexual activity: Yes    Birth control/protection: Post-menopausal  Lifestyle  . Physical activity:    Days per week: Not on file    Minutes per session: Not on file  . Stress: Not on file  Relationships  . Social connections:    Talks on phone: Not on file    Gets together: Not on file    Attends religious service: Not on file    Active  member of club or organization: Not on file    Attends meetings of clubs or organizations: Not on file    Relationship status: Not on file  . Intimate partner violence:    Fear of current or ex partner: Not on file    Emotionally abused: Not on file    Physically abused: Not on file    Forced sexual activity: Not on file  Other Topics Concern  . Not on file  Social History Narrative   Entered 02/2014:   Married.   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   Works about 16 hours a week now.   Says she "has to stay busy"--in summer works in yard, garden.    Always working on a project in the house etc.       Review of Systems  All other systems reviewed  and are negative.      Objective:   Physical Exam Vitals signs reviewed.  Constitutional:      General: She is not in acute distress.    Appearance: Normal appearance. She is normal weight. She is not ill-appearing.  Cardiovascular:     Rate and Rhythm: Normal rate and regular rhythm.     Pulses: Normal pulses.     Heart sounds: Normal heart sounds. No murmur. No friction rub. No gallop.   Pulmonary:     Effort: Pulmonary effort is normal. No respiratory distress.     Breath sounds: Normal breath sounds. No stridor. No wheezing, rhonchi or rales.  Chest:     Chest wall: No tenderness.  Abdominal:     General: Bowel sounds are normal. There is no distension.     Palpations: Abdomen is soft. There is no mass.     Tenderness: There is no abdominal tenderness. There is no rebound.  Musculoskeletal:     Right lower leg: No edema.     Left lower leg: No edema.  Neurological:     Mental Status: She is alert.           Assessment & Plan:  Essential hypertension - Plan: CBC with Differential/Platelet, COMPLETE METABOLIC PANEL WITH GFR, Lipid panel  Osteopenia, unspecified location - Plan: VITAMIN D 25 Hydroxy (Vit-D Deficiency, Fractures)  Vitamin D deficiency - Plan: VITAMIN D 25 Hydroxy (Vit-D Deficiency, Fractures)  Blood pressure today is excellent.  I will check a CBC, CMP, fasting lipid panel.  Given her osteopenia and her history of vitamin D deficiency I will check a vitamin D level as well.  I encouraged her to use 1200 mg a day of calcium.  Would recommend a bone density test either later this year or next year.  Mammogram is up-to-date.  We discussed the possibility of repeating a Pap smear and I left that up to her discretion.  Immunizations are up-to-date.  Regular anticipatory guidance is provided

## 2018-04-30 LAB — CBC WITH DIFFERENTIAL/PLATELET
Absolute Monocytes: 504 cells/uL (ref 200–950)
Basophils Absolute: 42 cells/uL (ref 0–200)
Basophils Relative: 0.8 %
EOS PCT: 1.5 %
Eosinophils Absolute: 78 cells/uL (ref 15–500)
HEMATOCRIT: 42 % (ref 35.0–45.0)
Hemoglobin: 14 g/dL (ref 11.7–15.5)
Lymphs Abs: 1685 cells/uL (ref 850–3900)
MCH: 29.7 pg (ref 27.0–33.0)
MCHC: 33.3 g/dL (ref 32.0–36.0)
MCV: 89 fL (ref 80.0–100.0)
MPV: 10.3 fL (ref 7.5–12.5)
Monocytes Relative: 9.7 %
NEUTROS PCT: 55.6 %
Neutro Abs: 2891 cells/uL (ref 1500–7800)
Platelets: 274 10*3/uL (ref 140–400)
RBC: 4.72 10*6/uL (ref 3.80–5.10)
RDW: 12.5 % (ref 11.0–15.0)
Total Lymphocyte: 32.4 %
WBC: 5.2 10*3/uL (ref 3.8–10.8)

## 2018-04-30 LAB — COMPLETE METABOLIC PANEL WITH GFR
AG Ratio: 2 (calc) (ref 1.0–2.5)
ALT: 14 U/L (ref 6–29)
AST: 19 U/L (ref 10–35)
Albumin: 4.5 g/dL (ref 3.6–5.1)
Alkaline phosphatase (APISO): 71 U/L (ref 33–130)
BUN: 14 mg/dL (ref 7–25)
CO2: 26 mmol/L (ref 20–32)
CREATININE: 0.83 mg/dL (ref 0.60–0.93)
Calcium: 10 mg/dL (ref 8.6–10.4)
Chloride: 106 mmol/L (ref 98–110)
GFR, EST NON AFRICAN AMERICAN: 71 mL/min/{1.73_m2} (ref >=60)
GFR, Est African American: 83 mL/min/{1.73_m2} (ref >=60)
Globulin: 2.3 g/dL (calc) (ref 1.9–3.7)
Glucose, Bld: 99 mg/dL (ref 65–99)
Potassium: 4.5 mmol/L (ref 3.5–5.3)
Sodium: 143 mmol/L (ref 135–146)
Total Bilirubin: 0.6 mg/dL (ref 0.2–1.2)
Total Protein: 6.8 g/dL (ref 6.1–8.1)

## 2018-04-30 LAB — LIPID PANEL
CHOL/HDL RATIO: 2.9 (calc) (ref <5.0)
Cholesterol: 238 mg/dL — ABNORMAL HIGH (ref <200)
HDL: 81 mg/dL (ref >50)
LDL Cholesterol (Calc): 136 mg/dL (calc) — ABNORMAL HIGH
Non-HDL Cholesterol (Calc): 157 mg/dL (calc) — ABNORMAL HIGH (ref <130)
Triglycerides: 100 mg/dL (ref <150)

## 2018-04-30 LAB — VITAMIN D 25 HYDROXY (VIT D DEFICIENCY, FRACTURES): Vit D, 25-Hydroxy: 47 ng/mL (ref 30–100)

## 2018-05-01 ENCOUNTER — Other Ambulatory Visit: Payer: Self-pay | Admitting: Family Medicine

## 2018-05-01 DIAGNOSIS — I10 Essential (primary) hypertension: Secondary | ICD-10-CM

## 2018-05-01 MED ORDER — AMLODIPINE BESYLATE 10 MG PO TABS: 10.0000 mg | ORAL_TABLET | Freq: Every day | ORAL | 3 refills | Status: DC

## 2018-05-01 MED ORDER — BENAZEPRIL HCL 20 MG PO TABS: 20.0000 mg | ORAL_TABLET | Freq: Every day | ORAL | 3 refills | Status: DC

## 2018-05-01 MED ORDER — ATORVASTATIN CALCIUM 10 MG PO TABS: 10.0000 mg | ORAL_TABLET | Freq: Every day | ORAL | 1 refills | Status: DC

## 2018-05-21 DIAGNOSIS — R69 Illness, unspecified: Secondary | ICD-10-CM | POA: Diagnosis not present

## 2018-06-07 DIAGNOSIS — I10 Essential (primary) hypertension: Secondary | ICD-10-CM | POA: Diagnosis not present

## 2018-06-07 DIAGNOSIS — H25013 Cortical age-related cataract, bilateral: Secondary | ICD-10-CM | POA: Diagnosis not present

## 2018-06-07 DIAGNOSIS — H43813 Vitreous degeneration, bilateral: Secondary | ICD-10-CM | POA: Diagnosis not present

## 2018-06-07 DIAGNOSIS — H5200 Hypermetropia, unspecified eye: Secondary | ICD-10-CM | POA: Diagnosis not present

## 2018-06-18 DIAGNOSIS — Z0100 Encounter for examination of eyes and vision without abnormal findings: Secondary | ICD-10-CM | POA: Diagnosis not present

## 2018-10-28 ENCOUNTER — Other Ambulatory Visit: Payer: Self-pay

## 2018-10-28 ENCOUNTER — Ambulatory Visit (INDEPENDENT_AMBULATORY_CARE_PROVIDER_SITE_OTHER): Payer: Medicare HMO | Admitting: Family Medicine

## 2018-10-28 ENCOUNTER — Encounter: Payer: Self-pay | Admitting: Family Medicine

## 2018-10-28 ENCOUNTER — Other Ambulatory Visit: Payer: Self-pay | Admitting: Family Medicine

## 2018-10-28 VITALS — BP 128/68 | HR 77 | Temp 98.4°F | Resp 16 | Ht 64.0 in | Wt 149.0 lb

## 2018-10-28 DIAGNOSIS — I10 Essential (primary) hypertension: Secondary | ICD-10-CM

## 2018-10-28 DIAGNOSIS — Z0001 Encounter for general adult medical examination with abnormal findings: Secondary | ICD-10-CM | POA: Diagnosis not present

## 2018-10-28 DIAGNOSIS — E7800 Pure hypercholesterolemia, unspecified: Secondary | ICD-10-CM | POA: Diagnosis not present

## 2018-10-28 DIAGNOSIS — E041 Nontoxic single thyroid nodule: Secondary | ICD-10-CM | POA: Diagnosis not present

## 2018-10-28 DIAGNOSIS — Z0000 Encounter for general adult medical examination without abnormal findings: Secondary | ICD-10-CM

## 2018-10-28 DIAGNOSIS — M8580 Other specified disorders of bone density and structure, unspecified site: Secondary | ICD-10-CM

## 2018-10-28 MED ORDER — DICLOFENAC SODIUM 1 % TD GEL: 2.0000 g | Freq: Four times a day (QID) | TRANSDERMAL | 1 refills | Status: DC

## 2018-10-28 NOTE — Progress Notes (Signed)
Subjective:    Patient ID: Sarah Williams, female    DOB: 10/15/1945, 73 y.o.   MRN: [id number]  HPI Patient is a very pleasant 73 year old Caucasian female here today for a general checkup.  Past medical history is significant for hypertension.  However, recently, her blood pressure has been running low.  Her systolic blood pressure has been between 90 and 120.  The majority are less than 110.  She occasionally feels dizzy with this.  Her last mammogram was in September and is due again in September.  Her last bone density was in 2018 and showed borderline osteoporosis.  She is on calcium and vitamin D.  Her next bone density will be due next year.  She is not requiring a Pap smear due to her age.  Her last colonoscopy was in 2015 and was normal.  She is due again in 2025.  Her immunization records are listed below.  She is due for a tetanus shot but she politely declines that today: Immunization History  Administered Date(s) Administered   Influenza, High Dose Seasonal PF 02/13/2018   Influenza,inj,Quad PF,6+ Mos 02/18/2014, 04/08/2015, 04/13/2016   Influenza-Unspecified 02/13/2018   Pneumococcal Conjugate-13 02/18/2014   Pneumococcal Polysaccharide-23 10/07/2014   Tdap 06/18/2007   Zoster 10/10/2014    Past Medical History:  Diagnosis Date   Hyperlipidemia    11/2010   Hypertension    Osteoporosis    PONV (postoperative nausea and vomiting)    Past Surgical History:  Procedure Laterality Date   BREAST SURGERY     Benign--Bilateral Lumpectomies--Benign   COLONOSCOPY N/A 03/16/2014   Procedure: COLONOSCOPY;  Surgeon: Corbin Adeobert M Rourk, MD;  Location: [facility];  Service: Endoscopy;  Laterality: N/A;  2:15 PM   COSMETIC SURGERY  03/08   blepharoplasty   Current Outpatient Medications on File Prior to Visit  Medication Sig Dispense Refill   amLODipine (NORVASC) 10 MG tablet Take 1 tablet (10 mg total) by mouth daily. 90 tablet 3   atorvastatin (LIPITOR) 10 MG  tablet Take 1 tablet (10 mg total) by mouth daily. 90 tablet 1   benazepril (LOTENSIN) 20 MG tablet Take 1 tablet (20 mg total) by mouth daily. 90 tablet 3   Cholecalciferol (VITAMIN D) 2000 UNITS tablet Take 2,000 Units by mouth every evening.     Multiple Vitamin (MULTIVITAMIN) tablet Take 1 tablet by mouth every evening. Centrum Silver     No current facility-administered medications on file prior to visit.    No Known Allergies Social History   Socioeconomic History   Marital status: Married    Spouse name: Not on file   Number of children: Not on file   Years of education: Not on file   Highest education level: Not on file  Occupational History   Not on file  Social Needs   Financial resource strain: Not on file   Food insecurity    Worry: Not on file    Inability: Not on file   Transportation needs    Medical: Not on file    Non-medical: Not on file  Tobacco Use   Smoking status: Never Smoker   Smokeless tobacco: Never Used  Substance and Sexual Activity   Alcohol use: Yes    Alcohol/week: 5.0 standard drinks    Types: 5 Glasses of wine per week   Drug use: No   Sexual activity: Yes    Birth control/protection: Post-menopausal  Lifestyle   Physical activity    Days per week: Not  on file    Minutes per session: Not on file   Stress: Not on file  Relationships   Social connections    Talks on phone: Not on file    Gets together: Not on file    Attends religious service: Not on file    Active member of club or organization: Not on file    Attends meetings of clubs or organizations: Not on file    Relationship status: Not on file   Intimate partner violence    Fear of current or ex partner: Not on file    Emotionally abused: Not on file    Physically abused: Not on file    Forced sexual activity: Not on file  Other Topics Concern   Not on file  Social History Narrative   Entered 02/2014:   Married.   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   Works about 16 hours a week now.   Says she "has to stay busy"--in summer works in yard, garden.    Always working on a project in the house etc.       Review of Systems  All other systems reviewed and are negative.      Objective:   Physical Exam Vitals signs reviewed.  Constitutional:      General: She is not in acute distress.    Appearance: Normal appearance. She is normal weight. She is not ill-appearing.  HENT:     Head: Normocephalic and atraumatic.     Right Ear: Tympanic membrane, ear canal and external ear normal. There is no impacted cerumen.     Left Ear: Tympanic membrane, ear canal and external ear normal. There is no impacted cerumen.     Nose: Nose normal. No congestion or rhinorrhea.     Mouth/Throat:     Mouth: Mucous membranes are moist.     Pharynx: No oropharyngeal exudate or posterior oropharyngeal erythema.  Eyes:     Extraocular Movements: Extraocular movements intact.     Conjunctiva/sclera: Conjunctivae normal.     Pupils: Pupils are equal, round, and reactive to light.  Neck:     Musculoskeletal: Neck supple. No neck rigidity.     Thyroid: Thyroid mass and thyromegaly present.     Vascular: No carotid bruit.   Cardiovascular:     Rate and Rhythm: Normal rate and regular rhythm.     Pulses: Normal pulses.     Heart sounds: Normal heart sounds. No murmur. No friction rub. No gallop.   Pulmonary:     Effort: Pulmonary effort is normal. No respiratory distress.     Breath sounds: Normal breath sounds. No stridor. No wheezing, rhonchi or rales.  Chest:     Chest wall: No tenderness.  Abdominal:     General: Bowel sounds are normal. There is no distension.     Palpations: Abdomen is soft. There is no mass.     Tenderness: There is no abdominal tenderness. There is no rebound.  Musculoskeletal:     Right lower leg: No edema.     Left  lower leg: No edema.  Lymphadenopathy:     Cervical: No cervical adenopathy.  Skin:    General: Skin is warm.     Coloration: Skin is not jaundiced or pale.     Findings: No bruising, erythema, lesion or rash.  Neurological:     General: No focal deficit  present.     Mental Status: She is alert and oriented to person, place, and time. Mental status is at baseline.     Cranial Nerves: No cranial nerve deficit.     Sensory: No sensory deficit.     Motor: No weakness.     Coordination: Coordination normal.     Gait: Gait normal.     Deep Tendon Reflexes: Reflexes normal.  Psychiatric:        Mood and Affect: Mood normal.        Behavior: Behavior normal.        Thought Content: Thought content normal.        Judgment: Judgment normal.           Assessment & Plan:  The primary encounter diagnosis was Pure hypercholesterolemia. Diagnoses of Thyroid nodule, Osteopenia, unspecified location, Essential hypertension, and General medical exam were also pertinent to this visit. Patient's physical exam today is significant for a nodule on the right side of her thyroid gland and some thyroid megaly.  I will schedule the patient for ultrasound of the thyroid gland to evaluate further.  Her blood pressures been running low at home.  Therefore I recommended she discontinue amlodipine.  If her systolic blood pressure runs higher than 140 afterwards, we can resume amlodipine albeit at a lower dose 5 mg a day.  Check CBC, CMP, fasting lipid panel.  The patient reports significant myalgias all throughout her body since starting Lipitor.  If her LDL cholesterol is below 130 we will discontinue the Lipitor.  The remainder of her cancer screening is up-to-date.  Did recommend a tetanus vaccine but she politely declined that.  She denies any problems with memory loss, depression, falls or problems performing her ADLs.  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

## 2018-10-29 LAB — COMPLETE METABOLIC PANEL WITH GFR
AG Ratio: 2 (calc) (ref 1.0–2.5)
ALT: 15 U/L (ref 6–29)
AST: 19 U/L (ref 10–35)
Albumin: 4.4 g/dL (ref 3.6–5.1)
Alkaline phosphatase (APISO): 76 U/L (ref 37–153)
BUN: 15 mg/dL (ref 7–25)
CO2: 28 mmol/L (ref 20–32)
Calcium: 9.4 mg/dL (ref 8.6–10.4)
Chloride: 103 mmol/L (ref 98–110)
Creat: 0.77 mg/dL (ref 0.60–0.93)
GFR, Est African American: 90 mL/min/{1.73_m2} (ref >=60)
GFR, Est Non African American: 78 mL/min/{1.73_m2} (ref >=60)
Globulin: 2.2 g/dL (calc) (ref 1.9–3.7)
Glucose, Bld: 127 mg/dL — ABNORMAL HIGH (ref 65–99)
Potassium: 3.9 mmol/L (ref 3.5–5.3)
Sodium: 139 mmol/L (ref 135–146)
Total Bilirubin: 0.8 mg/dL (ref 0.2–1.2)
Total Protein: 6.6 g/dL (ref 6.1–8.1)

## 2018-10-29 LAB — LIPID PANEL
Cholesterol: 144 mg/dL (ref <200)
HDL: 88 mg/dL (ref >=50)
LDL Cholesterol (Calc): 45 mg/dL (calc)
Non-HDL Cholesterol (Calc): 56 mg/dL (calc) (ref <130)
Total CHOL/HDL Ratio: 1.6 (calc) (ref <5.0)
Triglycerides: 40 mg/dL (ref <150)

## 2018-10-29 LAB — CBC WITH DIFFERENTIAL/PLATELET
Absolute Monocytes: 1300 cells/uL — ABNORMAL HIGH (ref 200–950)
Basophils Absolute: 39 cells/uL (ref 0–200)
Basophils Relative: 0.3 %
Eosinophils Absolute: 13 cells/uL — ABNORMAL LOW (ref 15–500)
Eosinophils Relative: 0.1 %
HCT: 37.5 % (ref 35.0–45.0)
Hemoglobin: 13.1 g/dL (ref 11.7–15.5)
Lymphs Abs: 1508 cells/uL (ref 850–3900)
MCH: 30.2 pg (ref 27.0–33.0)
MCHC: 34.9 g/dL (ref 32.0–36.0)
MCV: 86.4 fL (ref 80.0–100.0)
MPV: 10.3 fL (ref 7.5–12.5)
Monocytes Relative: 10 %
Neutro Abs: 10140 cells/uL — ABNORMAL HIGH (ref 1500–7800)
Neutrophils Relative %: 78 %
Platelets: 254 10*3/uL (ref 140–400)
RBC: 4.34 10*6/uL (ref 3.80–5.10)
RDW: 12.7 % (ref 11.0–15.0)
Total Lymphocyte: 11.6 %
WBC: 13 10*3/uL — ABNORMAL HIGH (ref 3.8–10.8)

## 2018-10-30 ENCOUNTER — Telehealth: Payer: Self-pay | Admitting: Family Medicine

## 2018-10-30 ENCOUNTER — Other Ambulatory Visit: Payer: Self-pay

## 2018-10-30 ENCOUNTER — Ambulatory Visit (HOSPITAL_COMMUNITY)
Admission: RE | Admit: 2018-10-30 | Discharge: 2018-10-30 | Disposition: A | Discharge status: Home / Self Care | Payer: Medicare HMO | Source: Ambulatory Visit | Attending: Family Medicine | Admitting: Family Medicine

## 2018-10-30 DIAGNOSIS — E041 Nontoxic single thyroid nodule: Secondary | ICD-10-CM

## 2018-10-30 NOTE — Telephone Encounter (Signed)
PA Submitted through [email or web address] and received the following:  [employer] has not yet replied to your PA request. You may close this dialog, return to your dashboard, and perform other tasks.  To check for an update later, open this request again from your dashboard.  If [employer] has not replied to your request within 24 hours please contact [employer] at [phone]. 

## 2018-10-31 NOTE — Telephone Encounter (Signed)
PA Case: [id number], Status: Denied. Questions? Contact [phone].  Will inform pt of ins denial and that she may get this otc via [employer]

## 2018-11-26 DIAGNOSIS — R69 Illness, unspecified: Secondary | ICD-10-CM | POA: Diagnosis not present

## 2019-02-10 DIAGNOSIS — R69 Illness, unspecified: Secondary | ICD-10-CM | POA: Diagnosis not present

## 2019-02-12 DIAGNOSIS — Z8249 Family history of ischemic heart disease and other diseases of the circulatory system: Secondary | ICD-10-CM | POA: Diagnosis not present

## 2019-02-12 DIAGNOSIS — I10 Essential (primary) hypertension: Secondary | ICD-10-CM | POA: Diagnosis not present

## 2019-02-12 DIAGNOSIS — Z823 Family history of stroke: Secondary | ICD-10-CM | POA: Diagnosis not present

## 2019-05-02 ENCOUNTER — Other Ambulatory Visit: Payer: Self-pay

## 2019-05-02 ENCOUNTER — Ambulatory Visit (INDEPENDENT_AMBULATORY_CARE_PROVIDER_SITE_OTHER): Payer: Medicare HMO | Admitting: Family Medicine

## 2019-05-02 VITALS — BP 180/90 | HR 68 | Temp 97.7°F | Resp 16 | Ht 64.0 in | Wt 156.0 lb

## 2019-05-02 DIAGNOSIS — I10 Essential (primary) hypertension: Secondary | ICD-10-CM | POA: Diagnosis not present

## 2019-05-02 DIAGNOSIS — E7800 Pure hypercholesterolemia, unspecified: Secondary | ICD-10-CM | POA: Diagnosis not present

## 2019-05-02 MED ORDER — BENAZEPRIL HCL 20 MG PO TABS: 20.0000 mg | ORAL_TABLET | Freq: Every day | ORAL | 3 refills | Status: DC

## 2019-05-02 NOTE — Progress Notes (Signed)
Subjective:    Patient ID: Sarah Williams, female    DOB: 1946-02-21, 74 y.o.   MRN: [id number]  HPI  10/2018 Patient is a very pleasant 74 year old Caucasian female here today for a general checkup.  Past medical history is significant for hypertension.  However, recently, her blood pressure has been running low.  Her systolic blood pressure has been between 90 and 120.  The majority are less than 110.  She occasionally feels dizzy with this.  Her last mammogram was in September and is due again in September.  Her last bone density was in 2018 and showed borderline osteoporosis.  She is on calcium and vitamin D.  Her next bone density will be due next year.  She is not requiring a Pap smear due to her age.  Her last colonoscopy was in 2015 and was normal.  She is due again in 2025.  Her immunization records are listed below.  She is due for a tetanus shot but she politely declines that today:  At that time, my plan was: Patient's physical exam today is significant for a nodule on the right side of her thyroid gland and some thyromegaly.  I will schedule the patient for ultrasound of the thyroid gland to evaluate further.  Her blood pressures been running low at home.  Therefore I recommended she discontinue amlodipine.  If her systolic blood pressure runs higher than 140 afterwards, we can resume amlodipine albeit at a lower dose 5 mg a day.  Check CBC, CMP, fasting lipid panel.  The patient reports significant myalgias all throughout her body since starting Lipitor.  If her LDL cholesterol is below 130 we will discontinue the Lipitor.  The remainder of her cancer screening is up-to-date.  Did recommend a tetanus vaccine but she politely declined that.  She denies any problems with memory loss, depression, falls or problems performing her ADLs.  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  Patient has been doing well since I last saw her.  Her blood pressure here today is extremely high at 180/90 however she is checking it every day at home.  She brings her meter today and her blood pressure at home is outstanding.  Her systolic blood pressures between 120 and 130 and her diastolic blood pressures between 70 and 80.  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  She denies any chest pain shortness of breath or dyspnea on exertion.  She did discontinue the 10 mg of Lipitor she was taking after her last visit and her myalgias have improved dramatically.  However she is concerned that her cholesterol will be high today.  Immunization History  Administered Date(s) Administered  . Influenza, High Dose Seasonal PF 02/13/2018  . Influenza,inj,Quad PF,6+ Mos 02/18/2014, 04/08/2015, 04/13/2016  . Influenza-Unspecified 02/13/2018  . Pneumococcal Conjugate-13 02/18/2014  . Pneumococcal Polysaccharide-23 10/07/2014  . Tdap 06/18/2007  . Zoster 10/10/2014    Past Medical History:  Diagnosis Date  . Hyperlipidemia    11/2010  . Hypertension   . Osteoporosis   . PONV (postoperative nausea and vomiting)    Past Surgical History:  Procedure Laterality Date  . BREAST SURGERY     Benign--Bilateral Lumpectomies--Benign  .  COLONOSCOPY N/A 03/16/2014   Procedure: COLONOSCOPY;  Surgeon: Corbin Ade, MD;  Location: [facility];  Service: Endoscopy;  Laterality: N/A;  2:15 PM  . COSMETIC SURGERY  03/08   blepharoplasty   Current Outpatient Medications on File Prior to Visit  Medication Sig Dispense Refill  . amLODipine (NORVASC) 10 MG tablet Take 1 tablet (10 mg total) by mouth daily. 90 tablet 3  . atorvastatin (LIPITOR) 10 MG tablet Take 1 tablet by mouth once daily 90 tablet 0  . benazepril  (LOTENSIN) 20 MG tablet Take 1 tablet (20 mg total) by mouth daily. 90 tablet 3  . Cholecalciferol (VITAMIN D) 2000 UNITS tablet Take 2,000 Units by mouth every evening.    . diclofenac sodium (VOLTAREN) 1 % GEL Apply 2 g topically 4 (four) times daily. 50 g 1  . Multiple Vitamin (MULTIVITAMIN) tablet Take 1 tablet by mouth every evening. Centrum Silver     No current facility-administered medications on file prior to visit.   No Known Allergies Social History   Socioeconomic History  . Marital status: Married    Spouse name: Not on file  . Number of children: Not on file  . Years of education: Not on file  . Highest education level: Not on file  Occupational History  . Not on file  Tobacco Use  . Smoking status: Never Smoker  . Smokeless tobacco: Never Used  Substance and Sexual Activity  . Alcohol use: Yes    Alcohol/week: 5.0 standard drinks    Types: 5 Glasses of wine per week  . Drug use: No  . Sexual activity: Yes    Birth control/protection: Post-menopausal  Other Topics Concern  . Not on file  Social History Narrative   Entered 02/2014:   Married.   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   Works about 16 hours a week now.   Says she "has to stay busy"--in summer works in yard, garden.    Always working on a project in the house etc.    Social Determinants of Health   Financial Resource Strain:   . Difficulty of Paying Living Expenses: Not on file  Food Insecurity:   . Worried About Programme researcher, broadcasting/film/video in the Last Year: Not on file  . Ran Out of Food in the Last Year: Not on file  Transportation Needs:   . Lack of Transportation (Medical): Not on file  . Lack of Transportation (Non-Medical): Not on file  Physical Activity:   . Days of Exercise per Week: Not on file  . Minutes of Exercise per Session: Not on file  Stress:   . Feeling of Stress : Not on file   Social Connections:   . Frequency of Communication with Friends and Family: Not on file  . Frequency of Social Gatherings with Friends and Family: Not on file  . Attends Religious Services: Not on file  . Active Member of Clubs or Organizations: Not on file  . Attends Banker Meetings: Not on file  . Marital Status: Not on file  Intimate Partner Violence:   . Fear of Current or Ex-Partner: Not on file  . Emotionally Abused: Not on file  . Physically Abused: Not on file  . Sexually Abused: Not on file      Review of Systems  All other systems reviewed and are negative.  Objective:   Physical Exam Vitals reviewed.  Constitutional:      General: She is not in acute distress.    Appearance: Normal appearance. She is normal weight. She is not ill-appearing.  HENT:     Head: Normocephalic and atraumatic.     Right Ear: Tympanic membrane, ear canal and external ear normal. There is no impacted cerumen.     Left Ear: Tympanic membrane, ear canal and external ear normal. There is no impacted cerumen.     Nose: Nose normal. No congestion or rhinorrhea.     Mouth/Throat:     Mouth: Mucous membranes are moist.     Pharynx: No oropharyngeal exudate or posterior oropharyngeal erythema.  Eyes:     Extraocular Movements: Extraocular movements intact.     Conjunctiva/sclera: Conjunctivae normal.     Pupils: Pupils are equal, round, and reactive to light.  Neck:     Thyroid: Thyroid mass and thyromegaly present.     Vascular: No carotid bruit.   Cardiovascular:     Rate and Rhythm: Normal rate and regular rhythm.     Pulses: Normal pulses.     Heart sounds: Normal heart sounds. No murmur. No friction rub. No gallop.   Pulmonary:     Effort: Pulmonary effort is normal. No respiratory distress.     Breath sounds: Normal breath sounds. No stridor. No wheezing, rhonchi or rales.  Chest:     Chest wall: No tenderness.  Abdominal:     General: Bowel sounds are normal.  There is no distension.     Palpations: Abdomen is soft. There is no mass.     Tenderness: There is no abdominal tenderness. There is no rebound.  Musculoskeletal:     Cervical back: Neck supple. No rigidity.     Right lower leg: No edema.     Left lower leg: No edema.  Lymphadenopathy:     Cervical: No cervical adenopathy.  Skin:    General: Skin is warm.     Coloration: Skin is not jaundiced or pale.     Findings: No bruising, erythema, lesion or rash.  Neurological:     General: No focal deficit present.     Mental Status: She is alert and oriented to person, place, and time. Mental status is at baseline.     Cranial Nerves: No cranial nerve deficit.     Sensory: No sensory deficit.     Motor: No weakness.     Coordination: Coordination normal.     Gait: Gait normal.     Deep Tendon Reflexes: Reflexes normal.  Psychiatric:        Mood and Affect: Mood normal.        Behavior: Behavior normal.        Thought Content: Thought content normal.        Judgment: Judgment normal.           Assessment & Plan:  Pure hypercholesterolemia - Plan: CBC with Differential, COMPLETE METABOLIC PANEL WITH GFR, Lipid Panel  Essential hypertension - Plan: CBC with Differential, COMPLETE METABOLIC PANEL WITH GFR, Lipid Panel, benazepril (LOTENSIN) 20 MG tablet  Thyroid cyst appears stable.  Blood pressure today is elevated but she has documented whitecoat syndrome.  Blood pressure at home is outstanding.  Therefore we will make no changes in her antihypertensive therapy.  I will check a CBC, CMP, and fasting lipid panel.  Ideally I would like her LDL cholesterol to be below 100.  If her LDL cholesterol  is well above goal, consider adding 10 mg pravastatin.

## 2019-05-03 LAB — COMPLETE METABOLIC PANEL WITH GFR
AG Ratio: 2 (calc) (ref 1.0–2.5)
ALT: 14 U/L (ref 6–29)
AST: 19 U/L (ref 10–35)
Albumin: 4.5 g/dL (ref 3.6–5.1)
Alkaline phosphatase (APISO): 75 U/L (ref 37–153)
BUN: 20 mg/dL (ref 7–25)
CO2: 25 mmol/L (ref 20–32)
Calcium: 9.8 mg/dL (ref 8.6–10.4)
Chloride: 103 mmol/L (ref 98–110)
Creat: 0.79 mg/dL (ref 0.60–0.93)
GFR, Est African American: 87 mL/min/{1.73_m2} (ref >=60)
GFR, Est Non African American: 75 mL/min/{1.73_m2} (ref >=60)
Globulin: 2.3 g/dL (calc) (ref 1.9–3.7)
Glucose, Bld: 87 mg/dL (ref 65–99)
Potassium: 4.6 mmol/L (ref 3.5–5.3)
Sodium: 141 mmol/L (ref 135–146)
Total Bilirubin: 0.6 mg/dL (ref 0.2–1.2)
Total Protein: 6.8 g/dL (ref 6.1–8.1)

## 2019-05-03 LAB — LIPID PANEL
Cholesterol: 218 mg/dL — ABNORMAL HIGH (ref <200)
HDL: 75 mg/dL (ref >=50)
LDL Cholesterol (Calc): 111 mg/dL (calc) — ABNORMAL HIGH
Non-HDL Cholesterol (Calc): 143 mg/dL (calc) — ABNORMAL HIGH (ref <130)
Total CHOL/HDL Ratio: 2.9 (calc) (ref <5.0)
Triglycerides: 201 mg/dL — ABNORMAL HIGH (ref <150)

## 2019-05-03 LAB — CBC WITH DIFFERENTIAL/PLATELET
Absolute Monocytes: 610 cells/uL (ref 200–950)
Basophils Absolute: 43 cells/uL (ref 0–200)
Basophils Relative: 0.7 %
Eosinophils Absolute: 122 cells/uL (ref 15–500)
Eosinophils Relative: 2 %
HCT: 44.5 % (ref 35.0–45.0)
Hemoglobin: 14.8 g/dL (ref 11.7–15.5)
Lymphs Abs: 1946 cells/uL (ref 850–3900)
MCH: 29.2 pg (ref 27.0–33.0)
MCHC: 33.3 g/dL (ref 32.0–36.0)
MCV: 87.8 fL (ref 80.0–100.0)
MPV: 10.2 fL (ref 7.5–12.5)
Monocytes Relative: 10 %
Neutro Abs: 3379 cells/uL (ref 1500–7800)
Neutrophils Relative %: 55.4 %
Platelets: 256 10*3/uL (ref 140–400)
RBC: 5.07 10*6/uL (ref 3.80–5.10)
RDW: 12.9 % (ref 11.0–15.0)
Total Lymphocyte: 31.9 %
WBC: 6.1 10*3/uL (ref 3.8–10.8)

## 2019-05-05 MED ORDER — PRAVASTATIN SODIUM 10 MG PO TABS: 10.0000 mg | ORAL_TABLET | Freq: Every day | ORAL | 1 refills | Status: DC

## 2019-06-02 DIAGNOSIS — R69 Illness, unspecified: Secondary | ICD-10-CM | POA: Diagnosis not present

## 2019-06-16 DIAGNOSIS — R69 Illness, unspecified: Secondary | ICD-10-CM | POA: Diagnosis not present

## 2019-09-02 DIAGNOSIS — H5200 Hypermetropia, unspecified eye: Secondary | ICD-10-CM | POA: Diagnosis not present

## 2019-10-30 ENCOUNTER — Encounter: Payer: Self-pay | Admitting: Family Medicine

## 2019-10-30 DIAGNOSIS — Z1231 Encounter for screening mammogram for malignant neoplasm of breast: Secondary | ICD-10-CM | POA: Diagnosis not present

## 2019-11-02 ENCOUNTER — Other Ambulatory Visit: Payer: Self-pay | Admitting: Family Medicine

## 2019-11-04 ENCOUNTER — Other Ambulatory Visit: Payer: Self-pay

## 2019-11-04 ENCOUNTER — Encounter: Payer: Self-pay | Admitting: Family Medicine

## 2019-11-04 ENCOUNTER — Ambulatory Visit (INDEPENDENT_AMBULATORY_CARE_PROVIDER_SITE_OTHER): Payer: Medicare HMO | Admitting: Family Medicine

## 2019-11-04 VITALS — BP 150/74 | HR 68 | Temp 97.8°F | Resp 12 | Ht 64.0 in | Wt 153.0 lb

## 2019-11-04 DIAGNOSIS — E7800 Pure hypercholesterolemia, unspecified: Secondary | ICD-10-CM | POA: Diagnosis not present

## 2019-11-04 DIAGNOSIS — E041 Nontoxic single thyroid nodule: Secondary | ICD-10-CM

## 2019-11-04 DIAGNOSIS — I10 Essential (primary) hypertension: Secondary | ICD-10-CM

## 2019-11-04 MED ORDER — PRAVASTATIN SODIUM 10 MG PO TABS: 10.0000 mg | ORAL_TABLET | Freq: Every day | ORAL | 1 refills | Status: DC

## 2019-11-04 NOTE — Progress Notes (Signed)
Subjective:    Patient ID: Sarah Williams, female    DOB: January 03, 1946, 74 y.o.   MRN: [id number]  HPI  05/02/19 Statin was held at last visit due to myalgias.  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  Patient has been doing well since I last saw her.  Her blood pressure here today is extremely high at 180/90 however she is checking it every day at home.  She brings her meter today and her blood pressure at home is outstanding.  Her systolic blood pressures between 120 and 130 and her diastolic blood pressures between 70 and 80.  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  She denies any chest pain shortness of breath or dyspnea on exertion.  She did discontinue the 10 mg of Lipitor she was taking after her last visit and her myalgias have improved dramatically.  However she is concerned that her cholesterol will be high today.  11/04/19 At her last visit, I suggested adding pravastatin 10 mg poqday due to elevated cholesterol levels.  Thankfully, the patient denies any myalgias or complications due to the cholesterol medication.  On examination today I have a difficult time palpating the nodule on the right side of her thyroid.  She denies any trouble swallowing.  She denies any subjective growth.  She denies any pain in her neck.  She is yet to receive the Covid vaccination.  I strongly encourage that.  Her blood pressure today is elevated at 150/74.  She believes that she may have some whitecoat syndrome.  She checks her blood pressure on a daily basis at home and she brings her blood pressure cuff with her today to verify this.  Her blood pressures at home are outstanding.  Her systolic blood pressure can vary between 113 and 126.  These are well controlled and certainly do not require further medication.  She denies any trouble  breathing.  She denies any shortness of breath or chest pain  Past Medical History:  Diagnosis Date  . Hyperlipidemia    11/2010  . Hypertension   . Osteoporosis   . PONV (postoperative nausea and vomiting)    Past Surgical History:  Procedure Laterality Date  . BREAST SURGERY     Benign--Bilateral Lumpectomies--Benign  . COLONOSCOPY N/A 03/16/2014   Procedure: COLONOSCOPY;  Surgeon: Corbin Ade, MD;  Location: [facility];  Service: Endoscopy;  Laterality: N/A;  2:15 PM  . COSMETIC SURGERY  03/08   blepharoplasty   Current Outpatient Medications on File Prior to Visit  Medication Sig Dispense Refill  . benazepril (LOTENSIN) 20 MG tablet Take 1 tablet (20 mg total) by mouth daily. 90 tablet 3  . Cholecalciferol (VITAMIN D) 2000 UNITS tablet Take 2,000 Units by mouth every evening.    . Multiple Vitamin (MULTIVITAMIN) tablet Take 1 tablet by mouth every evening. Centrum Silver    . pravastatin (PRAVACHOL) 10 MG tablet Take 1 tablet by mouth once daily 90 tablet 0   No current facility-administered medications on file prior to visit.   No Known Allergies Social History   Socioeconomic History  . Marital status: Married    Spouse name: Not on file  . Number of children: Not on file  . Years of education: Not on file  . Highest education level: Not on file  Occupational History  . Not on file  Tobacco  Use  . Smoking status: Never Smoker  . Smokeless tobacco: Never Used  Substance and Sexual Activity  . Alcohol use: Yes    Alcohol/week: 5.0 standard drinks    Types: 5 Glasses of wine per week  . Drug use: No  . Sexual activity: Yes    Birth control/protection: Post-menopausal  Other Topics Concern  . Not on file  Social History Narrative   Entered 02/2014:   Married.   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   Works about 16 hours a week now.   Says she "has  to stay busy"--in summer works in yard, garden.    Always working on a project in the house etc.    Social Determinants of Health   Financial Resource Strain:   . Difficulty of Paying Living Expenses:   Food Insecurity:   . Worried About Programme researcher, broadcasting/film/video in the Last Year:   . Barista in the Last Year:   Transportation Needs:   . Freight forwarder (Medical):   Marland Kitchen Lack of Transportation (Non-Medical):   Physical Activity:   . Days of Exercise per Week:   . Minutes of Exercise per Session:   Stress:   . Feeling of Stress :   Social Connections:   . Frequency of Communication with Friends and Family:   . Frequency of Social Gatherings with Friends and Family:   . Attends Religious Services:   . Active Member of Clubs or Organizations:   . Attends Banker Meetings:   Marland Kitchen Marital Status:   Intimate Partner Violence:   . Fear of Current or Ex-Partner:   . Emotionally Abused:   Marland Kitchen Physically Abused:   . Sexually Abused:       Review of Systems  All other systems reviewed and are negative.      Objective:   Physical Exam Vitals reviewed.  Constitutional:      General: She is not in acute distress.    Appearance: Normal appearance. She is normal weight. She is not ill-appearing.  HENT:     Head: Normocephalic and atraumatic.     Right Ear: Tympanic membrane, ear canal and external ear normal. There is no impacted cerumen.     Left Ear: Tympanic membrane, ear canal and external ear normal. There is no impacted cerumen.     Nose: Nose normal. No congestion or rhinorrhea.     Mouth/Throat:     Mouth: Mucous membranes are moist.     Pharynx: No oropharyngeal exudate or posterior oropharyngeal erythema.  Eyes:     Extraocular Movements: Extraocular movements intact.     Conjunctiva/sclera: Conjunctivae normal.     Pupils: Pupils are equal, round, and reactive to light.  Neck:     Thyroid: Thyroid mass and thyromegaly present.     Vascular: No  carotid bruit.   Cardiovascular:     Rate and Rhythm: Normal rate and regular rhythm.     Pulses: Normal pulses.     Heart sounds: Normal heart sounds. No murmur heard.  No friction rub. No gallop.   Pulmonary:     Effort: Pulmonary effort is normal. No respiratory distress.     Breath sounds: Normal breath sounds. No stridor. No wheezing, rhonchi or rales.  Chest:     Chest wall: No tenderness.  Abdominal:     General: Bowel sounds are normal.  There is no distension.     Palpations: Abdomen is soft. There is no mass.     Tenderness: There is no abdominal tenderness. There is no rebound.  Musculoskeletal:     Cervical back: Neck supple. No rigidity.     Right lower leg: No edema.     Left lower leg: No edema.  Lymphadenopathy:     Cervical: No cervical adenopathy.  Skin:    General: Skin is warm.     Coloration: Skin is not jaundiced or pale.     Findings: No bruising, erythema, lesion or rash.  Neurological:     General: No focal deficit present.     Mental Status: She is alert and oriented to person, place, and time. Mental status is at baseline.     Cranial Nerves: No cranial nerve deficit.     Sensory: No sensory deficit.     Motor: No weakness.     Coordination: Coordination normal.     Gait: Gait normal.     Deep Tendon Reflexes: Reflexes normal.  Psychiatric:        Mood and Affect: Mood normal.        Behavior: Behavior normal.        Thought Content: Thought content normal.        Judgment: Judgment normal.           Assessment & Plan:  Pure hypercholesterolemia - Plan: COMPLETE METABOLIC PANEL WITH GFR, Lipid panel  Essential hypertension  Thyroid nodule  Physical exam today is normal.  I have a difficult time palpating the thyroid nodule.  It certainly does not seem to be growing.  Blood pressure here is elevated however I believe this is a fluke.  Her blood pressures at home are outstanding.  I will check a CMP and a fasting lipid panel.  Ideally I  would like her LDL cholesterol to be below 130.  I encouraged the patient to receive the Covid vaccination at her earliest convenience.

## 2019-11-05 LAB — COMPLETE METABOLIC PANEL WITH GFR
AG Ratio: 2 (calc) (ref 1.0–2.5)
ALT: 17 U/L (ref 6–29)
AST: 22 U/L (ref 10–35)
Albumin: 4.3 g/dL (ref 3.6–5.1)
Alkaline phosphatase (APISO): 67 U/L (ref 37–153)
BUN: 17 mg/dL (ref 7–25)
CO2: 27 mmol/L (ref 20–32)
Calcium: 9.4 mg/dL (ref 8.6–10.4)
Chloride: 105 mmol/L (ref 98–110)
Creat: 0.87 mg/dL (ref 0.60–0.93)
GFR, Est African American: 77 mL/min/{1.73_m2} (ref >=60)
GFR, Est Non African American: 67 mL/min/{1.73_m2} (ref >=60)
Globulin: 2.1 g/dL (calc) (ref 1.9–3.7)
Glucose, Bld: 92 mg/dL (ref 65–99)
Potassium: 4.6 mmol/L (ref 3.5–5.3)
Sodium: 142 mmol/L (ref 135–146)
Total Bilirubin: 0.6 mg/dL (ref 0.2–1.2)
Total Protein: 6.4 g/dL (ref 6.1–8.1)

## 2019-11-05 LAB — LIPID PANEL
Cholesterol: 194 mg/dL (ref <200)
HDL: 67 mg/dL (ref >=50)
LDL Cholesterol (Calc): 106 mg/dL (calc) — ABNORMAL HIGH
Non-HDL Cholesterol (Calc): 127 mg/dL (calc) (ref <130)
Total CHOL/HDL Ratio: 2.9 (calc) (ref <5.0)
Triglycerides: 118 mg/dL (ref <150)

## 2019-11-06 ENCOUNTER — Encounter: Payer: Self-pay | Admitting: *Deleted

## 2019-11-12 DIAGNOSIS — R922 Inconclusive mammogram: Secondary | ICD-10-CM | POA: Diagnosis not present

## 2019-11-12 DIAGNOSIS — R928 Other abnormal and inconclusive findings on diagnostic imaging of breast: Secondary | ICD-10-CM | POA: Diagnosis not present

## 2019-12-02 DIAGNOSIS — R69 Illness, unspecified: Secondary | ICD-10-CM | POA: Diagnosis not present

## 2019-12-10 DIAGNOSIS — Z0100 Encounter for examination of eyes and vision without abnormal findings: Secondary | ICD-10-CM | POA: Diagnosis not present

## 2020-01-30 ENCOUNTER — Encounter: Payer: Self-pay | Admitting: Family Medicine

## 2020-01-30 MED ORDER — PRAVASTATIN SODIUM 10 MG PO TABS: 10.0000 mg | ORAL_TABLET | Freq: Every day | ORAL | 1 refills | Status: DC

## 2020-04-01 DIAGNOSIS — R69 Illness, unspecified: Secondary | ICD-10-CM | POA: Diagnosis not present

## 2020-04-27 ENCOUNTER — Encounter: Payer: Medicare Other | Admitting: Family Medicine

## 2020-05-24 ENCOUNTER — Encounter: Payer: Self-pay | Admitting: Family Medicine

## 2020-05-24 ENCOUNTER — Other Ambulatory Visit: Payer: Self-pay

## 2020-05-24 ENCOUNTER — Ambulatory Visit (INDEPENDENT_AMBULATORY_CARE_PROVIDER_SITE_OTHER): Payer: Medicare Other | Admitting: Family Medicine

## 2020-05-24 VITALS — BP 156/88 | HR 78 | Temp 98.0°F | Resp 14 | Ht 64.0 in | Wt 158.0 lb

## 2020-05-24 DIAGNOSIS — E7800 Pure hypercholesterolemia, unspecified: Secondary | ICD-10-CM

## 2020-05-24 DIAGNOSIS — I10 Essential (primary) hypertension: Secondary | ICD-10-CM | POA: Diagnosis not present

## 2020-05-24 MED ORDER — PRAVASTATIN SODIUM 10 MG PO TABS: 10.0000 mg | ORAL_TABLET | Freq: Every day | ORAL | 1 refills | Status: DC

## 2020-05-24 MED ORDER — BENAZEPRIL HCL 20 MG PO TABS: 20.0000 mg | ORAL_TABLET | Freq: Every day | ORAL | 3 refills | Status: DC

## 2020-05-24 NOTE — Progress Notes (Signed)
Subjective:    Patient ID: Sarah Williams, female    DOB: 1945/09/08, 75 y.o.   MRN: [id number]  HPI  We reduced her benazepril due to hypotension.  However her blood pressure today is elevated.  She states that she checks her blood pressure occasionally.  She has about 6 values on her blood pressure cuff.  3 of the values showed a systolic blood pressure greater than 140.  3 of the values showed an excellent blood pressure.  Therefore I am not certain of what her consistent average would be.  She denies any chest pain shortness of breath or dyspnea on exertion.  She is yet to get the Covid shot yet and I encouraged her to reconsider.  She denies any of the myalgias that she was experiencing previously.  She is due today for fasting lab work to monitor her cholesterol Past Medical History:  Diagnosis Date  . Hyperlipidemia    11/2010  . Hypertension   . Osteoporosis   . PONV (postoperative nausea and vomiting)    Past Surgical History:  Procedure Laterality Date  . BREAST SURGERY     Benign--Bilateral Lumpectomies--Benign  . COLONOSCOPY N/A 03/16/2014   Procedure: COLONOSCOPY;  Surgeon: Corbin Ade, MD;  Location: [facility];  Service: Endoscopy;  Laterality: N/A;  2:15 PM  . COSMETIC SURGERY  03/08   blepharoplasty   Current Outpatient Medications on File Prior to Visit  Medication Sig Dispense Refill  . Cholecalciferol (VITAMIN D) 2000 UNITS tablet Take 2,000 Units by mouth every evening.    . Multiple Vitamin (MULTIVITAMIN) tablet Take 1 tablet by mouth every evening. Centrum Silver     No current facility-administered medications on file prior to visit.   No Known Allergies Social History   Socioeconomic History  . Marital status: Married    Spouse name: Not on file  . Number of children: Not on file  . Years of education: Not on file  . Highest education level: Not on file  Occupational History  . Not on file  Tobacco Use  . Smoking status: Never Smoker  .  Smokeless tobacco: Never Used  Substance and Sexual Activity  . Alcohol use: Yes    Alcohol/week: 5.0 standard drinks    Types: 5 Glasses of wine per week  . Drug use: No  . Sexual activity: Yes    Birth control/protection: Post-menopausal  Other Topics Concern  . Not on file  Social History Narrative   Entered 02/2014:   Married.   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   Works about 16 hours a week now.   Says she "has to stay busy"--in summer works in yard, garden.    Always working on a project in the house etc.    Social Determinants of Health   Financial Resource Strain: Not on file  Food Insecurity: Not on file  Transportation Needs: Not on file  Physical Activity: Not on file  Stress: Not on file  Social Connections: Not on file  Intimate Partner Violence: Not on file      Review of Systems  All other systems reviewed and are negative.      Objective:   Physical Exam Vitals reviewed.  Constitutional:      General: She is not in acute distress.    Appearance: Normal appearance. She is normal weight. She is not ill-appearing.  HENT:     Head: Normocephalic and atraumatic.     Right Ear: Tympanic membrane, ear canal and external ear normal. There is no impacted cerumen.     Left Ear: Tympanic membrane, ear canal and external ear normal. There is no impacted cerumen.     Nose: Nose normal. No congestion or rhinorrhea.     Mouth/Throat:     Mouth: Mucous membranes are moist.     Pharynx: No oropharyngeal exudate or posterior oropharyngeal erythema.  Eyes:     Extraocular Movements: Extraocular movements intact.     Conjunctiva/sclera: Conjunctivae normal.     Pupils: Pupils are equal, round, and reactive to light.  Neck:     Vascular: No carotid bruit.  Cardiovascular:     Rate and Rhythm: Normal rate and regular rhythm.     Pulses: Normal pulses.      Heart sounds: Normal heart sounds. No murmur heard. No friction rub. No gallop.   Pulmonary:     Effort: Pulmonary effort is normal. No respiratory distress.     Breath sounds: Normal breath sounds. No stridor. No wheezing, rhonchi or rales.  Chest:     Chest wall: No tenderness.  Abdominal:     General: Bowel sounds are normal. There is no distension.     Palpations: Abdomen is soft. There is no mass.     Tenderness: There is no abdominal tenderness. There is no rebound.  Musculoskeletal:     Cervical back: Neck supple. No rigidity.     Right lower leg: No edema.     Left lower leg: No edema.  Lymphadenopathy:     Cervical: No cervical adenopathy.  Skin:    General: Skin is warm.     Coloration: Skin is not jaundiced or pale.     Findings: No bruising, erythema, lesion or rash.  Neurological:     General: No focal deficit present.     Mental Status: She is alert and oriented to person, place, and time. Mental status is at baseline.     Cranial Nerves: No cranial nerve deficit.     Sensory: No sensory deficit.     Motor: No weakness.     Coordination: Coordination normal.     Gait: Gait normal.     Deep Tendon Reflexes: Reflexes normal.  Psychiatric:        Mood and Affect: Mood normal.        Behavior: Behavior normal.        Thought Content: Thought content normal.        Judgment: Judgment normal.           Assessment & Plan:  Pure hypercholesterolemia - Plan: CBC with Differential/Platelet, COMPLETE METABOLIC PANEL WITH GFR, Lipid panel  Essential hypertension - Plan: benazepril (LOTENSIN) 20 MG tablet, CBC with Differential/Platelet, COMPLETE METABOLIC PANEL WITH GFR, Lipid panel  Check a fasting lipid panel.  Goal LDL cholesterol is less than 100.  Blood pressure today is significantly elevated.  Patient will check her blood pressure 2 times a day for the next week or so and then email me the values.  If consistently greater than 140 systolic I would add a  low-dose hydrochlorothiazide to her benazepril.  Patient is comfortable with this plan.  Continue to encourage her to get the Covid vaccine

## 2020-05-25 LAB — COMPLETE METABOLIC PANEL WITH GFR
AG Ratio: 1.9 (calc) (ref 1.0–2.5)
ALT: 15 U/L (ref 6–29)
AST: 20 U/L (ref 10–35)
Albumin: 4.3 g/dL (ref 3.6–5.1)
Alkaline phosphatase (APISO): 66 U/L (ref 37–153)
BUN: 19 mg/dL (ref 7–25)
CO2: 27 mmol/L (ref 20–32)
Calcium: 9.4 mg/dL (ref 8.6–10.4)
Chloride: 107 mmol/L (ref 98–110)
Creat: 0.7 mg/dL (ref 0.60–0.93)
GFR, Est African American: 100 mL/min/{1.73_m2} (ref >=60)
GFR, Est Non African American: 87 mL/min/{1.73_m2} (ref >=60)
Globulin: 2.3 g/dL (calc) (ref 1.9–3.7)
Glucose, Bld: 96 mg/dL (ref 65–99)
Potassium: 4.4 mmol/L (ref 3.5–5.3)
Sodium: 143 mmol/L (ref 135–146)
Total Bilirubin: 0.5 mg/dL (ref 0.2–1.2)
Total Protein: 6.6 g/dL (ref 6.1–8.1)

## 2020-05-25 LAB — CBC WITH DIFFERENTIAL/PLATELET
Absolute Monocytes: 603 cells/uL (ref 200–950)
Basophils Absolute: 29 cells/uL (ref 0–200)
Basophils Relative: 0.5 %
Eosinophils Absolute: 99 cells/uL (ref 15–500)
Eosinophils Relative: 1.7 %
HCT: 42.2 % (ref 35.0–45.0)
Hemoglobin: 14.2 g/dL (ref 11.7–15.5)
Lymphs Abs: 1792 cells/uL (ref 850–3900)
MCH: 29.5 pg (ref 27.0–33.0)
MCHC: 33.6 g/dL (ref 32.0–36.0)
MCV: 87.6 fL (ref 80.0–100.0)
MPV: 10.4 fL (ref 7.5–12.5)
Monocytes Relative: 10.4 %
Neutro Abs: 3277 cells/uL (ref 1500–7800)
Neutrophils Relative %: 56.5 %
Platelets: 203 10*3/uL (ref 140–400)
RBC: 4.82 10*6/uL (ref 3.80–5.10)
RDW: 12.6 % (ref 11.0–15.0)
Total Lymphocyte: 30.9 %
WBC: 5.8 10*3/uL (ref 3.8–10.8)

## 2020-05-25 LAB — LIPID PANEL
Cholesterol: 175 mg/dL (ref <200)
HDL: 71 mg/dL (ref >=50)
LDL Cholesterol (Calc): 81 mg/dL (calc)
Non-HDL Cholesterol (Calc): 104 mg/dL (calc) (ref <130)
Total CHOL/HDL Ratio: 2.5 (calc) (ref <5.0)
Triglycerides: 130 mg/dL (ref <150)

## 2020-06-10 ENCOUNTER — Encounter: Payer: Self-pay | Admitting: Family Medicine

## 2020-07-28 ENCOUNTER — Other Ambulatory Visit: Payer: Self-pay | Admitting: Family Medicine

## 2020-07-28 ENCOUNTER — Encounter: Payer: Self-pay | Admitting: Family Medicine

## 2020-09-21 DIAGNOSIS — H5203 Hypermetropia, bilateral: Secondary | ICD-10-CM | POA: Diagnosis not present

## 2020-09-28 ENCOUNTER — Telehealth: Payer: Self-pay | Admitting: Family Medicine

## 2020-09-28 NOTE — Telephone Encounter (Signed)
No answer unable to leave a message for patient to call back and schedule Medicare Annual Wellness Visit (AWV) in office.   If not able to come in office, please offer to do virtually or by telephone.   Last AWV: 02/18/2014  Please schedule at anytime with [facility]-Nurse Health Advisor.  If any questions, please contact me at [phone]

## 2020-10-22 ENCOUNTER — Other Ambulatory Visit: Payer: Self-pay | Admitting: *Deleted

## 2020-10-22 MED ORDER — PRAVASTATIN SODIUM 10 MG PO TABS: 10.0000 mg | ORAL_TABLET | Freq: Every day | ORAL | 3 refills | Status: DC

## 2020-11-15 ENCOUNTER — Ambulatory Visit (INDEPENDENT_AMBULATORY_CARE_PROVIDER_SITE_OTHER): Payer: Medicare Other | Admitting: Family Medicine

## 2020-11-15 ENCOUNTER — Encounter: Payer: Self-pay | Admitting: Family Medicine

## 2020-11-15 ENCOUNTER — Other Ambulatory Visit: Payer: Self-pay

## 2020-11-15 VITALS — BP 146/62 | HR 70 | Temp 98.3°F | Resp 14 | Ht 64.0 in | Wt 155.0 lb

## 2020-11-15 DIAGNOSIS — E7800 Pure hypercholesterolemia, unspecified: Secondary | ICD-10-CM | POA: Diagnosis not present

## 2020-11-15 DIAGNOSIS — I10 Essential (primary) hypertension: Secondary | ICD-10-CM | POA: Diagnosis not present

## 2020-11-15 DIAGNOSIS — R0989 Other specified symptoms and signs involving the circulatory and respiratory systems: Secondary | ICD-10-CM | POA: Diagnosis not present

## 2020-11-15 NOTE — Progress Notes (Signed)
Subjective:    Patient ID: Sarah Williams, female    DOB: 01-22-1946, 75 y.o.   MRN: [id number]  HPI  Patient is a very pleasant 75 year old Caucasian female who presents today for a follow-up of her medical issues.  Her blood pressure at home has been running very low.  She does a lot of work outside and sweats vigorously throughout the day.  Her systolic blood pressure has been between 90 and 110.  The highest she is recorded is first thing in the morning and it is barely over 120.  At times she feels a little dizzy and lightheaded.  Her lab work from February was outstanding.  Her mammogram is coming due this fall.  Otherwise she is doing well Past Medical History:  Diagnosis Date   Hyperlipidemia    11/2010   Hypertension    Osteoporosis    PONV (postoperative nausea and vomiting)    Past Surgical History:  Procedure Laterality Date   BREAST SURGERY     Benign--Bilateral Lumpectomies--Benign   COLONOSCOPY N/A 03/16/2014   Procedure: COLONOSCOPY;  Surgeon: Corbin Ade, MD;  Location: [facility];  Service: Endoscopy;  Laterality: N/A;  2:15 PM   COSMETIC SURGERY  03/08   blepharoplasty   Current Outpatient Medications on File Prior to Visit  Medication Sig Dispense Refill   benazepril (LOTENSIN) 20 MG tablet Take 1 tablet (20 mg total) by mouth daily. 90 tablet 3   Cholecalciferol (VITAMIN D) 2000 UNITS tablet Take 2,000 Units by mouth every evening.     Multiple Vitamin (MULTIVITAMIN) tablet Take 1 tablet by mouth every evening. Centrum Silver     pravastatin (PRAVACHOL) 10 MG tablet Take 1 tablet (10 mg total) by mouth daily. 90 tablet 3   No current facility-administered medications on file prior to visit.   No Known Allergies Social History   Socioeconomic History   Marital status: Married    Spouse name: Not on file   Number of children: Not on file   Years of education: Not on file   Highest education level: Not on file  Occupational History   Not on file   Tobacco Use   Smoking status: Never   Smokeless tobacco: Never  Substance and Sexual Activity   Alcohol use: Yes    Alcohol/week: 5.0 standard drinks    Types: 5 Glasses of wine per week   Drug use: No   Sexual activity: Yes    Birth control/protection: Post-menopausal  Other Topics Concern   Not on file  Social History Narrative   Entered 02/2014:   Married.   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   Works about 16 hours a week now.   Says she "has to stay busy"--in summer works in yard, garden.    Always working on a project in the house etc.    Social Determinants of Health   Financial Resource Strain: Not on file  Food Insecurity: Not on file  Transportation Needs: Not on file  Physical Activity: Not on file  Stress: Not on file  Social Connections: Not on file  Intimate Partner Violence: Not on file      Review of Systems     Objective:   Physical Exam Vitals reviewed.  Constitutional:      General: She is not in acute distress.    Appearance: Normal appearance. She is normal weight.  She is not ill-appearing.  HENT:     Head: Normocephalic and atraumatic.     Right Ear: Tympanic membrane, ear canal and external ear normal. There is no impacted cerumen.     Left Ear: Tympanic membrane, ear canal and external ear normal. There is no impacted cerumen.     Nose: Nose normal. No congestion or rhinorrhea.     Mouth/Throat:     Mouth: Mucous membranes are moist.     Pharynx: No oropharyngeal exudate or posterior oropharyngeal erythema.  Eyes:     Extraocular Movements: Extraocular movements intact.     Conjunctiva/sclera: Conjunctivae normal.     Pupils: Pupils are equal, round, and reactive to light.  Neck:     Vascular: Carotid bruit present.  Cardiovascular:     Rate and Rhythm: Normal rate and regular rhythm.     Pulses: Normal pulses.     Heart  sounds: Normal heart sounds. No murmur heard.   No friction rub. No gallop.  Pulmonary:     Effort: Pulmonary effort is normal. No respiratory distress.     Breath sounds: Normal breath sounds. No stridor. No wheezing, rhonchi or rales.  Chest:     Chest wall: No tenderness.  Abdominal:     General: Bowel sounds are normal. There is no distension.     Palpations: Abdomen is soft. There is no mass.     Tenderness: There is no abdominal tenderness. There is no rebound.  Musculoskeletal:     Cervical back: Neck supple. No rigidity.     Right lower leg: No edema.     Left lower leg: No edema.  Lymphadenopathy:     Cervical: No cervical adenopathy.  Skin:    General: Skin is warm.     Coloration: Skin is not jaundiced or pale.     Findings: No bruising, erythema, lesion or rash.  Neurological:     General: No focal deficit present.     Mental Status: She is alert and oriented to person, place, and time. Mental status is at baseline.     Cranial Nerves: No cranial nerve deficit.     Sensory: No sensory deficit.     Motor: No weakness.     Coordination: Coordination normal.     Gait: Gait normal.     Deep Tendon Reflexes: Reflexes normal.  Psychiatric:        Mood and Affect: Mood normal.        Behavior: Behavior normal.        Thought Content: Thought content normal.        Judgment: Judgment normal.          Assessment & Plan:  Pure hypercholesterolemia - Plan: CBC with Differential/Platelet, COMPLETE METABOLIC PANEL WITH GFR, Lipid panel  Essential hypertension - Plan: CBC with Differential/Platelet, COMPLETE METABOLIC PANEL WITH GFR, Lipid panel  Left carotid bruit - Plan: US Carotid Duplex Bilateral I believe her blood pressures is too low due to dehydration.  I recommended that she reduce her benazepril to 10 mg a day and continue to monitor her blood pressure at home.  Check CBC CMP and lipid panel.  She does have a left-sided carotid bruit that I have not appreciated  before.  Of asked her to start taking an aspirin 81 mg a day and I will schedule the patient for carotid Dopplers to evaluate further.

## 2020-11-16 LAB — CBC WITH DIFFERENTIAL/PLATELET
Absolute Monocytes: 599 cells/uL (ref 200–950)
Basophils Absolute: 38 cells/uL (ref 0–200)
Basophils Relative: 0.6 %
Eosinophils Absolute: 132 cells/uL (ref 15–500)
Eosinophils Relative: 2.1 %
HCT: 41.2 % (ref 35.0–45.0)
Hemoglobin: 13.7 g/dL (ref 11.7–15.5)
Lymphs Abs: 1613 cells/uL (ref 850–3900)
MCH: 29.5 pg (ref 27.0–33.0)
MCHC: 33.3 g/dL (ref 32.0–36.0)
MCV: 88.8 fL (ref 80.0–100.0)
MPV: 10.3 fL (ref 7.5–12.5)
Monocytes Relative: 9.5 %
Neutro Abs: 3919 cells/uL (ref 1500–7800)
Neutrophils Relative %: 62.2 %
Platelets: 237 10*3/uL (ref 140–400)
RBC: 4.64 10*6/uL (ref 3.80–5.10)
RDW: 12.6 % (ref 11.0–15.0)
Total Lymphocyte: 25.6 %
WBC: 6.3 10*3/uL (ref 3.8–10.8)

## 2020-11-16 LAB — LIPID PANEL
Cholesterol: 162 mg/dL (ref <200)
HDL: 67 mg/dL (ref >=50)
LDL Cholesterol (Calc): 74 mg/dL (calc)
Non-HDL Cholesterol (Calc): 95 mg/dL (calc) (ref <130)
Total CHOL/HDL Ratio: 2.4 (calc) (ref <5.0)
Triglycerides: 120 mg/dL (ref <150)

## 2020-11-16 LAB — COMPLETE METABOLIC PANEL WITH GFR
AG Ratio: 2 (calc) (ref 1.0–2.5)
ALT: 19 U/L (ref 6–29)
AST: 21 U/L (ref 10–35)
Albumin: 4.3 g/dL (ref 3.6–5.1)
Alkaline phosphatase (APISO): 64 U/L (ref 37–153)
BUN: 21 mg/dL (ref 7–25)
CO2: 25 mmol/L (ref 20–32)
Calcium: 9.5 mg/dL (ref 8.6–10.4)
Chloride: 109 mmol/L (ref 98–110)
Creat: 0.95 mg/dL (ref 0.60–1.00)
Globulin: 2.1 g/dL (calc) (ref 1.9–3.7)
Glucose, Bld: 109 mg/dL — ABNORMAL HIGH (ref 65–99)
Potassium: 4.4 mmol/L (ref 3.5–5.3)
Sodium: 143 mmol/L (ref 135–146)
Total Bilirubin: 0.4 mg/dL (ref 0.2–1.2)
Total Protein: 6.4 g/dL (ref 6.1–8.1)
eGFR: 63 mL/min/{1.73_m2} (ref >=60)

## 2020-11-18 ENCOUNTER — Ambulatory Visit
Admission: RE | Admit: 2020-11-18 | Discharge: 2020-11-18 | Disposition: A | Discharge status: Home / Self Care | Payer: Medicare HMO | Source: Ambulatory Visit | Attending: Family Medicine | Admitting: Family Medicine

## 2020-11-18 DIAGNOSIS — R0989 Other specified symptoms and signs involving the circulatory and respiratory systems: Secondary | ICD-10-CM

## 2020-11-18 DIAGNOSIS — I6522 Occlusion and stenosis of left carotid artery: Secondary | ICD-10-CM | POA: Diagnosis not present

## 2020-11-19 ENCOUNTER — Other Ambulatory Visit: Payer: Self-pay | Admitting: *Deleted

## 2020-11-19 MED ORDER — ROSUVASTATIN CALCIUM 10 MG PO TABS: 10.0000 mg | ORAL_TABLET | Freq: Every day | ORAL | 3 refills | Status: DC

## 2021-02-14 DIAGNOSIS — H524 Presbyopia: Secondary | ICD-10-CM | POA: Diagnosis not present

## 2021-03-24 ENCOUNTER — Other Ambulatory Visit: Payer: Self-pay

## 2021-03-24 ENCOUNTER — Ambulatory Visit (INDEPENDENT_AMBULATORY_CARE_PROVIDER_SITE_OTHER): Payer: Medicare Other

## 2021-03-24 VITALS — Ht 64.0 in | Wt 155.0 lb

## 2021-03-24 DIAGNOSIS — Z0000 Encounter for general adult medical examination without abnormal findings: Secondary | ICD-10-CM

## 2021-03-24 DIAGNOSIS — Z780 Asymptomatic menopausal state: Secondary | ICD-10-CM

## 2021-03-24 DIAGNOSIS — M8580 Other specified disorders of bone density and structure, unspecified site: Secondary | ICD-10-CM | POA: Diagnosis not present

## 2021-03-24 NOTE — Patient Instructions (Signed)
Ms. Grefe , Thank you for taking time to come for your Medicare Wellness Visit. I appreciate your ongoing commitment to your health goals. Please review the following plan we discussed and let me know if I can assist you in the future.   Screening recommendations/referrals: Colonoscopy: Done 03/16/2014 Repeat in 10 years  Mammogram: Scheduled.  Bone Density: Order placed today to schedule for after first of year.  Recommended yearly ophthalmology/optometry visit for glaucoma screening and checkup Recommended yearly dental visit for hygiene and checkup  Vaccinations: Influenza vaccine: Done 01/16/2021 Pneumococcal vaccine: Done 02/18/2014 and 10/07/2014 Tdap vaccine: Done 06/18/2007 Repeat in 10 years  Shingles vaccine: Shingrix discussed. Please contact your pharmacy for coverage information.     Covid-19:Declined  Advanced directives: Please bring a copy of your health care power of attorney and living will to the office to be added to your chart at your convenience.   Conditions/risks identified: KEEP UP THE GOOD WORK!!  Next appointment: Follow up in one year for your annual wellness visit 2023.   Preventive Care 65 Years and Older, Female Preventive care refers to lifestyle choices and visits with your health care provider that can promote health and wellness. What does preventive care include? A yearly physical exam. This is also called an annual well check. Dental exams once or twice a year. Routine eye exams. Ask your health care provider how often you should have your eyes checked. Personal lifestyle choices, including: Daily care of your teeth and gums. Regular physical activity. Eating a healthy diet. Avoiding tobacco and drug use. Limiting alcohol use. Practicing safe sex. Taking low-dose aspirin every day. Taking vitamin and mineral supplements as recommended by your health care provider. What happens during an annual well check? The services and screenings done  by your health care provider during your annual well check will depend on your age, overall health, lifestyle risk factors, and family history of disease. Counseling  Your health care provider may ask you questions about your: Alcohol use. Tobacco use. Drug use. Emotional well-being. Home and relationship well-being. Sexual activity. Eating habits. History of falls. Memory and ability to understand (cognition). Work and work Astronomer. Reproductive health. Screening  You may have the following tests or measurements: Height, weight, and BMI. Blood pressure. Lipid and cholesterol levels. These may be checked every 5 years, or more frequently if you are over 66 years old. Skin check. Lung cancer screening. You may have this screening every year starting at age 2 if you have a 30-pack-year history of smoking and currently smoke or have quit within the past 15 years. Fecal occult blood test (FOBT) of the stool. You may have this test every year starting at age 73. Flexible sigmoidoscopy or colonoscopy. You may have a sigmoidoscopy every 5 years or a colonoscopy every 10 years starting at age 33. Hepatitis C blood test. Hepatitis B blood test. Sexually transmitted disease (STD) testing. Diabetes screening. This is done by checking your blood sugar (glucose) after you have not eaten for a while (fasting). You may have this done every 1-3 years. Bone density scan. This is done to screen for osteoporosis. You may have this done starting at age 66. Mammogram. This may be done every 1-2 years. Talk to your health care provider about how often you should have regular mammograms. Talk with your health care provider about your test results, treatment options, and if necessary, the need for more tests. Vaccines  Your health care provider may recommend certain vaccines, such as: Influenza  vaccine. This is recommended every year. Tetanus, diphtheria, and acellular pertussis (Tdap, Td) vaccine. You  may need a Td booster every 10 years. Zoster vaccine. You may need this after age 45. Pneumococcal 13-valent conjugate (PCV13) vaccine. One dose is recommended after age 39. Pneumococcal polysaccharide (PPSV23) vaccine. One dose is recommended after age 12. Talk to your health care provider about which screenings and vaccines you need and how often you need them. This information is not intended to replace advice given to you by your health care provider. Make sure you discuss any questions you have with your health care provider. Document Released: 04/30/2015 Document Revised: 12/22/2015 Document Reviewed: 02/02/2015 Elsevier Interactive Patient Education  2017 [employer] Prevention in the Home Falls can cause injuries. They can happen to people of all ages. There are many things you can do to make your home safe and to help prevent falls. What can I do on the outside of my home? Regularly fix the edges of walkways and driveways and fix any cracks. Remove anything that might make you trip as you walk through a door, such as a raised step or threshold. Trim any bushes or trees on the path to your home. Use bright outdoor lighting. Clear any walking paths of anything that might make someone trip, such as rocks or tools. Regularly check to see if handrails are loose or broken. Make sure that both sides of any steps have handrails. Any raised decks and porches should have guardrails on the edges. Have any leaves, snow, or ice cleared regularly. Use sand or salt on walking paths during winter. Clean up any spills in your garage right away. This includes oil or grease spills. What can I do in the bathroom? Use night lights. Install grab bars by the toilet and in the tub and shower. Do not use towel bars as grab bars. Use non-skid mats or decals in the tub or shower. If you need to sit down in the shower, use a plastic, non-slip stool. Keep the floor dry. Clean up any water that spills  on the floor as soon as it happens. Remove soap buildup in the tub or shower regularly. Attach bath mats securely with double-sided non-slip rug tape. Do not have throw rugs and other things on the floor that can make you trip. What can I do in the bedroom? Use night lights. Make sure that you have a light by your bed that is easy to reach. Do not use any sheets or blankets that are too big for your bed. They should not hang down onto the floor. Have a firm chair that has side arms. You can use this for support while you get dressed. Do not have throw rugs and other things on the floor that can make you trip. What can I do in the kitchen? Clean up any spills right away. Avoid walking on wet floors. Keep items that you use a lot in easy-to-reach places. If you need to reach something above you, use a strong step stool that has a grab bar. Keep electrical cords out of the way. Do not use floor polish or wax that makes floors slippery. If you must use wax, use non-skid floor wax. Do not have throw rugs and other things on the floor that can make you trip. What can I do with my stairs? Do not leave any items on the stairs. Make sure that there are handrails on both sides of the stairs and use them. Fix  handrails that are broken or loose. Make sure that handrails are as long as the stairways. Check any carpeting to make sure that it is firmly attached to the stairs. Fix any carpet that is loose or worn. Avoid having throw rugs at the top or bottom of the stairs. If you do have throw rugs, attach them to the floor with carpet tape. Make sure that you have a light switch at the top of the stairs and the bottom of the stairs. If you do not have them, ask someone to add them for you. What else can I do to help prevent falls? Wear shoes that: Do not have high heels. Have rubber bottoms. Are comfortable and fit you well. Are closed at the toe. Do not wear sandals. If you use a stepladder: Make  sure that it is fully opened. Do not climb a closed stepladder. Make sure that both sides of the stepladder are locked into place. Ask someone to hold it for you, if possible. Clearly mark and make sure that you can see: Any grab bars or handrails. First and last steps. Where the edge of each step is. Use tools that help you move around (mobility aids) if they are needed. These include: Canes. Walkers. Scooters. Crutches. Turn on the lights when you go into a dark area. Replace any light bulbs as soon as they burn out. Set up your furniture so you have a clear path. Avoid moving your furniture around. If any of your floors are uneven, fix them. If there are any pets around you, be aware of where they are. Review your medicines with your doctor. Some medicines can make you feel dizzy. This can increase your chance of falling. Ask your doctor what other things that you can do to help prevent falls. This information is not intended to replace advice given to you by your health care provider. Make sure you discuss any questions you have with your health care provider. Document Released: 01/28/2009 Document Revised: 09/09/2015 Document Reviewed: 05/08/2014 Elsevier Interactive Patient Education  2017 [employer].

## 2021-03-24 NOTE — Progress Notes (Signed)
Subjective:   Sarah Williams is a 75 y.o. female who presents for an Initial Medicare Annual Wellness Visit. Virtual Visit via Telephone Note  I connected with  Sarah Williams on 03/24/21 at  8:15 AM EST by telephone and verified that I am speaking with the correct person using two identifiers.  Location: Patient: Home Provider: [facility] Persons participating in the virtual visit: patient/Nurse Health Advisor   I discussed the limitations, risks, security and privacy concerns of performing an evaluation and management service by telephone and the availability of in person appointments. The patient expressed understanding and agreed to proceed.  Interactive audio and video telecommunications were attempted between this nurse and patient, however failed, due to patient having technical difficulties OR patient did not have access to video capability.  We continued and completed visit with audio only.  Some vital signs may be absent or patient reported.   Sarah Dash, LPN  Review of Systems     Cardiac Risk Factors include: advanced age (>57men, >82 women);hypertension;dyslipidemia     Objective:    Today's Vitals   03/24/21 0810  Weight: 155 lb (70.3 kg)  Height: 5\' 4"  (1.626 m)   Body mass index is 26.61 kg/m.  Advanced Directives 03/24/2021 03/16/2014  Does Patient Have a Medical Advance Directive? Yes No;Yes  Type of Advance Directive Living will;Healthcare Power of Attorney Living will  Copy of Healthcare Power of Attorney in Chart? No - copy requested -    Current Medications (verified) Outpatient Encounter Medications as of 03/24/2021  Medication Sig   benazepril (LOTENSIN) 20 MG tablet Take 1 tablet (20 mg total) by mouth daily.   Cholecalciferol (VITAMIN D) 2000 UNITS tablet Take 2,000 Units by mouth every evening.   Multiple Vitamin (MULTIVITAMIN) tablet Take 1 tablet by mouth every evening. Centrum Silver   rosuvastatin (CRESTOR) 10 MG tablet Take 1  tablet (10 mg total) by mouth daily.   No facility-administered encounter medications on file as of 03/24/2021.    Allergies (verified) Patient has no known allergies.   History: Past Medical History:  Diagnosis Date   Hyperlipidemia    11/2010   Hypertension    Osteoporosis    PONV (postoperative nausea and vomiting)    Past Surgical History:  Procedure Laterality Date   BREAST SURGERY     Benign--Bilateral Lumpectomies--Benign   COLONOSCOPY N/A 03/16/2014   Procedure: COLONOSCOPY;  Surgeon: 03/18/2014, MD;  Location: [facility];  Service: Endoscopy;  Laterality: N/A;  2:15 PM   COSMETIC SURGERY  03/08   blepharoplasty   Family History  Problem Relation Age of Onset   Hypertension Father    Kidney disease Father    Stroke Father    Heart disease Mother 82       CAD   Heart disease Paternal Grandfather 66       CAD, MI   Social History   Socioeconomic History   Marital status: Married    Spouse name: [id number]   Number of children: 2   Years of education: Not on file   Highest education level: Not on file  Occupational History   Not on file  Tobacco Use   Smoking status: Never   Smokeless tobacco: Never  Substance and Sexual Activity   Alcohol use: Yes    Alcohol/week: 5.0 standard drinks    Types: 5 Glasses of wine per week   Drug use: No   Sexual activity: Yes    Birth control/protection:  Post-menopausal  Other Topics Concern   Not on file  Social History Narrative   Entered 02/2014:   Married.   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   Works about 16 hours a week now.   Says she "has to stay busy"--in summer works in yard, garden.    Always working on a project in the house etc.    5 grandaughters   Social Determinants of Health   Financial Resource Strain: Low Risk    Difficulty of Paying Living Expenses: Not hard at all  Food  Insecurity: No Food Insecurity   Worried About Programme researcher, broadcasting/film/video in the Last Year: Never true   Barista in the Last Year: Never true  Transportation Needs: No Transportation Needs   Lack of Transportation (Medical): No   Lack of Transportation (Non-Medical): No  Physical Activity: Sufficiently Active   Days of Exercise per Week: 3 days   Minutes of Exercise per Session: 60 min  Stress: No Stress Concern Present   Feeling of Stress : Not at all  Social Connections: Moderately Integrated   Frequency of Communication with Friends and Family: More than three times a week   Frequency of Social Gatherings with Friends and Family: More than three times a week   Attends Religious Services: Never   Database administrator or Organizations: Yes   Attends Engineer, structural: More than 4 times per year   Marital Status: Married    Tobacco Counseling Counseling given: Not Answered   Clinical Intake:     Pain : No/denies pain     BMI - recorded: 26.61 Nutritional Status: BMI 25 -29 Overweight Nutritional Risks: None Diabetes: No  How often do you need to have someone help you when you read instructions, pamphlets, or other written materials from your doctor or pharmacy?: 1 - Never  Diabetic?No  Interpreter Needed?: No  Information entered by :: MJ Larone Kliethermes, LPN   Activities of Daily Living In your present state of health, do you have any difficulty performing the following activities: 03/24/2021  Hearing? N  Vision? N  Difficulty concentrating or making decisions? N  Walking or climbing stairs? N  Dressing or bathing? N  Doing errands, shopping? N  Preparing Food and eating ? N  Using the Toilet? N  In the past six months, have you accidently leaked urine? N  Do you have problems with loss of bowel control? N  Managing your Medications? N  Managing your Finances? N  Housekeeping or managing your Housekeeping? N  Some recent data might be hidden     Patient Care Team: Donita Brooks, MD as PCP - General (Family Medicine)  Indicate any recent Medical Services you may have received from other than [facility] providers in the past year (date may be approximate).     Assessment:   This is a routine wellness examination for Sarah Williams.  Hearing/Vision screen Hearing Screening - Comments:: No hearing issues.  Vision Screening - Comments:: Glasses. [facility]. 2022.  Dietary issues and exercise activities discussed: Current Exercise Habits: Home exercise routine, Type of exercise: strength training/weights;walking, Time (Minutes): 60, Frequency (Times/Week): 3, Weekly Exercise (Minutes/Week): 180, Intensity: Intense, Exercise limited by: cardiac condition(s)   Goals Addressed             This Visit's Progress    Exercise 3x per week (30  min per time)       Continue to exercise regularly. Spend more time with family.       Depression Screen PHQ 2/9 Scores 03/24/2021 05/24/2020 11/04/2019 10/28/2018 04/29/2018 10/22/2017 04/23/2017  PHQ - 2 Score 0 0 0 0 0 0 0  PHQ- 9 Score - - - - - - -    Fall Risk Fall Risk  03/24/2021 05/24/2020 11/04/2019 05/02/2019 04/29/2018  Falls in the past year? 0 0 0 0 0  Number falls in past yr: 0 - - - -  Injury with Fall? 0 - - - -  Risk for fall due to : No Fall Risks No Fall Risks No Fall Risks - -  Follow up Falls prevention discussed Falls evaluation completed Falls evaluation completed Falls evaluation completed Falls evaluation completed    FALL RISK PREVENTION PERTAINING TO THE HOME:  Any stairs in or around the home? Yes  If so, are there any without handrails? No  Home free of loose throw rugs in walkways, pet beds, electrical cords, etc? Yes  Adequate lighting in your home to reduce risk of falls? Yes   ASSISTIVE DEVICES UTILIZED TO PREVENT FALLS:  Life alert? No  Use of a cane, walker or w/c? No  Grab bars in the bathroom? No  Shower chair or bench in shower?  Walk in  shower. Elevated toilet seat or a handicapped toilet? Yes   TIMED UP AND GO:  Was the test performed? No .  Phone visit.  Cognitive Function:     6CIT Screen 03/24/2021  What Year? 0 points  What month? 0 points  What time? 0 points  Count back from 20 0 points  Months in reverse 0 points  Repeat phrase 0 points  Total Score 0    Immunizations Immunization History  Administered Date(s) Administered   Influenza, High Dose Seasonal PF 02/13/2018, 02/10/2021   Influenza,inj,Quad PF,6+ Mos 02/18/2014, 04/08/2015, 04/13/2016, 02/14/2019   Influenza-Unspecified 02/13/2018, 04/01/2020   Pneumococcal Conjugate-13 02/18/2014   Pneumococcal Polysaccharide-23 10/07/2014   Tdap 06/18/2007   Zoster, Live 10/10/2014    TDAP status: Due, Education has been provided regarding the importance of this vaccine. Advised may receive this vaccine at local pharmacy or Health Dept. Aware to provide a copy of the vaccination record if obtained from local pharmacy or Health Dept. Verbalized acceptance and understanding.  Flu Vaccine status: Up to date  Pneumococcal vaccine status: Up to date  Covid-19 vaccine status: Declined, Education has been provided regarding the importance of this vaccine but patient still declined. Advised may receive this vaccine at local pharmacy or Health Dept.or vaccine clinic. Aware to provide a copy of the vaccination record if obtained from local pharmacy or Health Dept. Verbalized acceptance and understanding.  Qualifies for Shingles Vaccine? Yes   Zostavax completed Yes   Shingrix Completed?: No.    Education has been provided regarding the importance of this vaccine. Patient has been advised to call insurance company to determine out of pocket expense if they have not yet received this vaccine. Advised may also receive vaccine at local pharmacy or Health Dept. Verbalized acceptance and understanding.  Screening Tests Health Maintenance  Topic Date Due   COVID-19  Vaccine (1) Never done   Zoster Vaccines- Shingrix (1 of 2) Never done   TETANUS/TDAP  06/17/2017   MAMMOGRAM  10/29/2021   COLONOSCOPY (Pts 45-65yrs Insurance coverage will need to be confirmed)  03/16/2024   Pneumonia Vaccine 75+ Years old  Completed  INFLUENZA VACCINE  Completed   DEXA SCAN  Completed   Hepatitis C Screening  Completed   HPV VACCINES  Aged Out    Health Maintenance  Health Maintenance Due  Topic Date Due   COVID-19 Vaccine (1) Never done   Zoster Vaccines- Shingrix (1 of 2) Never done   TETANUS/TDAP  06/17/2017    Colorectal cancer screening: Type of screening: Colonoscopy. Completed 03/16/2014. Repeat every 10 years  Mammogram status: Completed Scheduled for 04/04/2021. Repeat every year  Bone Density status: Ordered 03/24/2021. Pt provided with contact info and advised to call to schedule appt.  Lung Cancer Screening: (Low Dose CT Chest recommended if Age 105-80 years, 30 pack-year currently smoking OR have quit w/in 15years.) does not qualify.  Non Smoker  Additional Screening:  Hepatitis C Screening: does qualify; Completed 04/26/2016  Vision Screening: Recommended annual ophthalmology exams for early detection of glaucoma and other disorders of the eye. Is the patient up to date with their annual eye exam?  Yes  Who is the provider or what is the name of the office in which the patient attends annual eye exams? [facility] If pt is not established with a provider, would they like to be referred to a provider to establish care? No .   Dental Screening: Recommended annual dental exams for proper oral hygiene  Community Resource Referral / Chronic Care Management: CRR required this visit?  No   CCM required this visit?  No      Plan:     I have personally reviewed and noted the following in the patient's chart:   Medical and social history Use of alcohol, tobacco or illicit drugs  Current medications and supplements including opioid  prescriptions. Patient is not currently taking opioid prescriptions. Functional ability and status Nutritional status Physical activity Advanced directives List of other physicians Hospitalizations, surgeries, and ER visits in previous 12 months Vitals Screenings to include cognitive, depression, and falls Referrals and appointments  In addition, I have reviewed and discussed with patient certain preventive protocols, quality metrics, and best practice recommendations. A written personalized care plan for preventive services as well as general preventive health recommendations were provided to patient.     Sarah Dash, LPN   [id number]   Nurse Notes: PHONE VISIT. PT AT HOME. NURSE AT [facility]. Pt has mammogram scheduled for 04/04/2021 at [facility] in [place] [facility]. Order placed for DEXA today. Pt declines Covid vaccine. Shingrix and Tdap discussed and how to obtain.

## 2021-04-01 DIAGNOSIS — Z1231 Encounter for screening mammogram for malignant neoplasm of breast: Secondary | ICD-10-CM | POA: Diagnosis not present

## 2021-05-03 DIAGNOSIS — E663 Overweight: Secondary | ICD-10-CM | POA: Diagnosis not present

## 2021-05-03 DIAGNOSIS — E785 Hyperlipidemia, unspecified: Secondary | ICD-10-CM | POA: Diagnosis not present

## 2021-05-03 DIAGNOSIS — I10 Essential (primary) hypertension: Secondary | ICD-10-CM | POA: Diagnosis not present

## 2021-05-04 ENCOUNTER — Other Ambulatory Visit: Payer: Self-pay | Admitting: Family Medicine

## 2021-05-04 DIAGNOSIS — I10 Essential (primary) hypertension: Secondary | ICD-10-CM

## 2021-05-13 ENCOUNTER — Other Ambulatory Visit: Payer: Self-pay | Admitting: Family Medicine

## 2021-05-13 DIAGNOSIS — I10 Essential (primary) hypertension: Secondary | ICD-10-CM

## 2021-05-19 ENCOUNTER — Ambulatory Visit (INDEPENDENT_AMBULATORY_CARE_PROVIDER_SITE_OTHER): Payer: PPO | Admitting: Family Medicine

## 2021-05-19 ENCOUNTER — Encounter: Payer: Self-pay | Admitting: Family Medicine

## 2021-05-19 ENCOUNTER — Other Ambulatory Visit: Payer: Self-pay

## 2021-05-19 VITALS — BP 152/78 | HR 70 | Temp 98.1°F | Resp 18 | Ht 64.0 in | Wt 157.0 lb

## 2021-05-19 DIAGNOSIS — R002 Palpitations: Secondary | ICD-10-CM | POA: Diagnosis not present

## 2021-05-19 DIAGNOSIS — E7800 Pure hypercholesterolemia, unspecified: Secondary | ICD-10-CM

## 2021-05-19 DIAGNOSIS — R0789 Other chest pain: Secondary | ICD-10-CM

## 2021-05-19 DIAGNOSIS — I10 Essential (primary) hypertension: Secondary | ICD-10-CM | POA: Diagnosis not present

## 2021-05-19 DIAGNOSIS — R0989 Other specified symptoms and signs involving the circulatory and respiratory systems: Secondary | ICD-10-CM | POA: Diagnosis not present

## 2021-05-19 NOTE — Progress Notes (Signed)
Subjective:    Patient ID: Sarah Williams, female    DOB: February 26, 1946, 76 y.o.   MRN: [id number]  HPI  In August, ordered carotid US due to bruit: IMPRESSION: 1. 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 2. No significant atherosclerotic plaque or evidence of stenosis in the right internal carotid artery. 3. Vertebral arteries are patent with normal antegrade flow. Patient is checking her blood pressure every day at home.  Her home blood pressures are outstanding.  Systolic blood pressures are between 120 and 130.  Diastolic blood pressures are between 60 and 70.  She denies any chest pain shortness of breath or dyspnea on exertion.  She does report myalgias.  She attributes this to the Crestor.  She states that she was tolerating the pravastatin better.  Her mammogram was performed last fall.  Patient states that occasionally she feels a tightness in her chest.  This occurs randomly.  She will also feel some palpitations.  She would like her thyroid checked.  She exercises on a daily basis and exercise does not always cause the tightness in her chest.  It can come at random times and last minutes.  She denies any syncope or presyncope but she does report a flushed sensation that radiates from the center of her chest into her back Past Medical History:  Diagnosis Date   Hyperlipidemia    11/2010   Hypertension    Osteoporosis    PONV (postoperative nausea and vomiting)    Past Surgical History:  Procedure Laterality Date   BREAST SURGERY     Benign--Bilateral Lumpectomies--Benign   COLONOSCOPY N/A 03/16/2014   Procedure: COLONOSCOPY;  Surgeon: Corbin Ade, MD;  [facility]: [facility];  Service: Endoscopy;  Laterality: N/A;  2:15 PM   COSMETIC SURGERY  03/08   blepharoplasty   Current Outpatient Medications on File Prior to Visit  Medication Sig Dispense Refill   benazepril (LOTENSIN) 20 MG tablet Take 1  tablet by mouth once daily 90 tablet 0   Cholecalciferol (VITAMIN D) 2000 UNITS tablet Take 2,000 Units by mouth every evening.     Multiple Vitamin (MULTIVITAMIN) tablet Take 1 tablet by mouth every evening. Centrum Silver     pravastatin (PRAVACHOL) 10 MG tablet Take 10 mg by mouth daily.     rosuvastatin (CRESTOR) 10 MG tablet Take 1 tablet (10 mg total) by mouth daily. 90 tablet 3   No current facility-administered medications on file prior to visit.   No Known Allergies Social History   Socioeconomic History   Marital status: Married    Spouse name: Iantha Fallen   Number of children: 2   Years of education: Not on file   Highest education level: Not on file  Occupational History   Not on file  Tobacco Use   Smoking status: Never   Smokeless tobacco: Never  Substance and Sexual Activity   Alcohol use: Yes    Alcohol/week: 5.0 standard drinks    Types: 5 Glasses of wine per week   Drug use: No   Sexual activity: Yes    Birth control/protection: Post-menopausal  Other Topics Concern   Not on file  Social History Narrative   Entered 02/2014:   Married.   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   Works about 16 hours a week now.   Says she "has to  stay busy"--in summer works in yard, garden.    Always working on a project in the house etc.    5 grandaughters   Social Determinants of Health   Financial Resource Strain: Low Risk    Difficulty of Paying Living Expenses: Not hard at all  Food Insecurity: No Food Insecurity   Worried About Programme researcher, broadcasting/film/video in the Last Year: Never true   Barista in the Last Year: Never true  Transportation Needs: No Transportation Needs   Lack of Transportation (Medical): No   Lack of Transportation (Non-Medical): No  Physical Activity: Sufficiently Active   Days of Exercise per Week: 3 days   Minutes of Exercise per Session: 60  min  Stress: No Stress Concern Present   Feeling of Stress : Not at all  Social Connections: Moderately Integrated   Frequency of Communication with Friends and Family: More than three times a week   Frequency of Social Gatherings with Friends and Family: More than three times a week   Attends Religious Services: Never   Database administrator or Organizations: Yes   Attends Engineer, structural: More than 4 times per year   Marital Status: Married  Catering manager Violence: Not At Risk   Fear of Current or Ex-Partner: No   Emotionally Abused: No   Physically Abused: No   Sexually Abused: No      Review of Systems     Objective:   Physical Exam Vitals reviewed.  Constitutional:      General: She is not in acute distress.    Appearance: Normal appearance. She is normal weight. She is not ill-appearing.  HENT:     Head: Normocephalic and atraumatic.     Right Ear: Tympanic membrane, ear canal and external ear normal. There is no impacted cerumen.     Left Ear: Tympanic membrane, ear canal and external ear normal. There is no impacted cerumen.     Nose: Nose normal. No congestion or rhinorrhea.     Mouth/Throat:     Mouth: Mucous membranes are moist.     Pharynx: No oropharyngeal exudate or posterior oropharyngeal erythema.  Eyes:     Extraocular Movements: Extraocular movements intact.     Conjunctiva/sclera: Conjunctivae normal.     Pupils: Pupils are equal, round, and reactive to light.  Neck:     Vascular: Carotid bruit present.  Cardiovascular:     Rate and Rhythm: Normal rate and regular rhythm.     Pulses: Normal pulses.     Heart sounds: Normal heart sounds. No murmur heard.   No friction rub. No gallop.  Pulmonary:     Effort: Pulmonary effort is normal. No respiratory distress.     Breath sounds: Normal breath sounds. No stridor. No wheezing, rhonchi or rales.  Chest:     Chest wall: No tenderness.  Abdominal:     General: Bowel sounds are normal.  There is no distension.     Palpations: Abdomen is soft. There is no mass.     Tenderness: There is no abdominal tenderness. There is no rebound.  Musculoskeletal:     Cervical back: Neck supple. No rigidity.     Right lower leg: No edema.     Left lower leg: No edema.  Lymphadenopathy:     Cervical: No cervical adenopathy.  Skin:    General: Skin is warm.     Coloration: Skin is not jaundiced or pale.     Findings: No  bruising, erythema, lesion or rash.  Neurological:     General: No focal deficit present.     Mental Status: She is alert and oriented to person, place, and time. Mental status is at baseline.     Cranial Nerves: No cranial nerve deficit.     Sensory: No sensory deficit.     Motor: No weakness.     Coordination: Coordination normal.     Gait: Gait normal.     Deep Tendon Reflexes: Reflexes normal.  Psychiatric:        Mood and Affect: Mood normal.        Behavior: Behavior normal.        Thought Content: Thought content normal.        Judgment: Judgment normal.          Assessment & Plan:  Pure hypercholesterolemia - Plan: CBC with Differential/Platelet, Lipid panel, COMPLETE METABOLIC PANEL WITH GFR  Left carotid bruit  Essential hypertension  Palpitations - Plan: T4, free, TSH, T3, Free, CT CARDIAC SCORING (SELF PAY ONLY)  Atypical chest pain - Plan: CT CARDIAC SCORING (SELF PAY ONLY) I suspect the patient is having panic attacks and anxiety causing the atypical chest pain.  However she does have carotid artery disease and hyperlipidemia.  Therefore I will risk stratify the patient with a coronary artery CT scan.  If she has a high level of stenosis on the cardiac CT, I will recommend cardiology consultation.  Given the palpitations I will also check TSH, T3 and T4.  Check CBC lipid panel and CMP.

## 2021-05-20 LAB — COMPLETE METABOLIC PANEL WITH GFR
AG Ratio: 2 (calc) (ref 1.0–2.5)
ALT: 18 U/L (ref 6–29)
AST: 23 U/L (ref 10–35)
Albumin: 4.6 g/dL (ref 3.6–5.1)
Alkaline phosphatase (APISO): 62 U/L (ref 37–153)
BUN: 18 mg/dL (ref 7–25)
CO2: 28 mmol/L (ref 20–32)
Calcium: 10 mg/dL (ref 8.6–10.4)
Chloride: 104 mmol/L (ref 98–110)
Creat: 0.94 mg/dL (ref 0.60–1.00)
Globulin: 2.3 g/dL (calc) (ref 1.9–3.7)
Glucose, Bld: 96 mg/dL (ref 65–99)
Potassium: 4.7 mmol/L (ref 3.5–5.3)
Sodium: 142 mmol/L (ref 135–146)
Total Bilirubin: 0.4 mg/dL (ref 0.2–1.2)
Total Protein: 6.9 g/dL (ref 6.1–8.1)
eGFR: 64 mL/min/{1.73_m2} (ref >=60)

## 2021-05-20 LAB — CBC WITH DIFFERENTIAL/PLATELET
Absolute Monocytes: 568 cells/uL (ref 200–950)
Basophils Absolute: 40 cells/uL (ref 0–200)
Basophils Relative: 0.6 %
Eosinophils Absolute: 73 cells/uL (ref 15–500)
Eosinophils Relative: 1.1 %
HCT: 43.6 % (ref 35.0–45.0)
Hemoglobin: 14.4 g/dL (ref 11.7–15.5)
Lymphs Abs: 1676 cells/uL (ref 850–3900)
MCH: 29.3 pg (ref 27.0–33.0)
MCHC: 33 g/dL (ref 32.0–36.0)
MCV: 88.8 fL (ref 80.0–100.0)
MPV: 10.5 fL (ref 7.5–12.5)
Monocytes Relative: 8.6 %
Neutro Abs: 4244 cells/uL (ref 1500–7800)
Neutrophils Relative %: 64.3 %
Platelets: 242 10*3/uL (ref 140–400)
RBC: 4.91 10*6/uL (ref 3.80–5.10)
RDW: 12.1 % (ref 11.0–15.0)
Total Lymphocyte: 25.4 %
WBC: 6.6 10*3/uL (ref 3.8–10.8)

## 2021-05-20 LAB — LIPID PANEL
Cholesterol: 154 mg/dL (ref <200)
HDL: 85 mg/dL (ref >=50)
LDL Cholesterol (Calc): 52 mg/dL (calc)
Non-HDL Cholesterol (Calc): 69 mg/dL (calc) (ref <130)
Total CHOL/HDL Ratio: 1.8 (calc) (ref <5.0)
Triglycerides: 90 mg/dL (ref <150)

## 2021-05-20 LAB — TSH: TSH: 1.1 mIU/L (ref 0.40–4.50)

## 2021-05-20 LAB — T4, FREE: Free T4: 1.2 ng/dL (ref 0.8–1.8)

## 2021-05-20 LAB — T3, FREE: T3, Free: 2.9 pg/mL (ref 2.3–4.2)

## 2021-05-23 ENCOUNTER — Telehealth: Payer: Self-pay | Admitting: Family Medicine

## 2021-05-23 ENCOUNTER — Telehealth: Payer: Self-pay | Admitting: *Deleted

## 2021-05-23 DIAGNOSIS — R0789 Other chest pain: Secondary | ICD-10-CM

## 2021-05-23 DIAGNOSIS — R002 Palpitations: Secondary | ICD-10-CM

## 2021-05-23 NOTE — Telephone Encounter (Signed)
-----   Message from Livingston Diones, [place] sent at 05/23/2021  9:13 AM EST ----- Regarding: Outside Order For Calcium Score I received an outside order for calcium score from Dr Susy Frizzle.  The diagnosis code he attached is Palpitation R00.2 and atypical chest pain R07.89. Can you please place the order under Dr Audie Box? The order has been scanned into the chart  Thanks! Nunzio Cobbs

## 2021-05-23 NOTE — Telephone Encounter (Signed)
Order for Cardiac Calcium Score placed in the system and under Dr. Flora Lipps.  Will send this message back to our CT Scheduler to call and schedule this appt for the pt.

## 2021-05-23 NOTE — Telephone Encounter (Signed)
RE: Outside Order For Calcium Score Received: Today Livingston Diones, RT  Nuala Alpha, LPN The patient has said she will not have it here. Can we cancel this order that was just placed? The patient refuses to go anywhere but [facility] for imaging.   Thanks!      Order cancelled on behalf of our [place], due to pt refusing to have this done in our office.

## 2021-05-23 NOTE — Telephone Encounter (Signed)
Received call from Delice Bison at [facility] CT Scan. Patient refuses to go anywhere other than [facility] for CT scan; requesting for order to be forwarded there.   Please advise at [phone].

## 2021-05-23 NOTE — Addendum Note (Signed)
Addended by: Loa Socks on: 05/23/2021 09:48 AM   Modules accepted: Orders

## 2021-06-14 ENCOUNTER — Encounter: Payer: Self-pay | Admitting: Family Medicine

## 2021-06-15 ENCOUNTER — Other Ambulatory Visit: Payer: Self-pay

## 2021-06-15 DIAGNOSIS — R0989 Other specified symptoms and signs involving the circulatory and respiratory systems: Secondary | ICD-10-CM

## 2021-06-15 DIAGNOSIS — R002 Palpitations: Secondary | ICD-10-CM

## 2021-06-15 DIAGNOSIS — R0789 Other chest pain: Secondary | ICD-10-CM

## 2021-08-03 ENCOUNTER — Ambulatory Visit (HOSPITAL_COMMUNITY)
Admission: RE | Admit: 2021-08-03 | Discharge: 2021-08-03 | Disposition: A | Discharge status: Home / Self Care | Payer: Self-pay | Source: Ambulatory Visit | Attending: Family Medicine | Admitting: Family Medicine

## 2021-08-03 DIAGNOSIS — R002 Palpitations: Secondary | ICD-10-CM | POA: Insufficient documentation

## 2021-08-03 DIAGNOSIS — R0789 Other chest pain: Secondary | ICD-10-CM | POA: Insufficient documentation

## 2021-08-13 ENCOUNTER — Other Ambulatory Visit: Payer: Self-pay | Admitting: Family Medicine

## 2021-08-13 DIAGNOSIS — I10 Essential (primary) hypertension: Secondary | ICD-10-CM

## 2021-08-15 NOTE — Telephone Encounter (Signed)
Requested Prescriptions  ?Pending Prescriptions Disp Refills  ?? benazepril (LOTENSIN) 20 MG tablet [Pharmacy Med Name: Benazepril HCl 20 MG Oral Tablet] 90 tablet 0  ?  Sig: Take 1 tablet by mouth once daily  ?  ? Cardiovascular:  ACE Inhibitors Failed - 08/13/2021  9:06 AM  ?  ?  Failed - Last BP in normal range  ?  BP Readings from Last 1 Encounters:  ?05/19/21 (!) 152/78  ?   ?  ?  Passed - Cr in normal range and within 180 days  ?  Creat  ?Date Value Ref Range Status  ?05/19/2021 0.94 0.60 - 1.00 mg/dL Final  ?   ?  ?  Passed - K in normal range and within 180 days  ?  Potassium  ?Date Value Ref Range Status  ?05/19/2021 4.7 3.5 - 5.3 mmol/L Final  ?   ?  ?  Passed - Patient is not pregnant  ?  ?  Passed - Valid encounter within last 6 months  ?  Recent Outpatient Visits   ?      ? 2 months ago Pure hypercholesterolemia  ? [facility] Pickard, Priscille Heidelberg, MD  ? 9 months ago Pure hypercholesterolemia  ? [facility] Pickard, Priscille Heidelberg, MD  ? 1 year ago Pure hypercholesterolemia  ? [facility] Pickard, Priscille Heidelberg, MD  ? 1 year ago Pure hypercholesterolemia  ? [facility] Pickard, Priscille Heidelberg, MD  ? 2 years ago Pure hypercholesterolemia  ? [facility] Pickard, Priscille Heidelberg, MD  ?  ?  ?Future Appointments   ?        ? In 3 months Pickard, Priscille Heidelberg, MD [facility]  ?  ? ?  ?  ?  ? ?

## 2021-11-07 ENCOUNTER — Other Ambulatory Visit: Payer: Self-pay | Admitting: Family Medicine

## 2021-11-07 DIAGNOSIS — I10 Essential (primary) hypertension: Secondary | ICD-10-CM

## 2021-11-17 ENCOUNTER — Ambulatory Visit (INDEPENDENT_AMBULATORY_CARE_PROVIDER_SITE_OTHER): Payer: PPO | Admitting: Family Medicine

## 2021-11-17 VITALS — BP 150/82 | HR 78 | Temp 98.3°F | Ht 64.0 in | Wt 155.0 lb

## 2021-11-17 DIAGNOSIS — I7090 Unspecified atherosclerosis: Secondary | ICD-10-CM

## 2021-11-17 DIAGNOSIS — E7800 Pure hypercholesterolemia, unspecified: Secondary | ICD-10-CM

## 2021-11-17 DIAGNOSIS — I10 Essential (primary) hypertension: Secondary | ICD-10-CM | POA: Diagnosis not present

## 2021-11-17 NOTE — Progress Notes (Signed)
Subjective:    Patient ID: Sarah Williams, female    DOB: Sep 27, 1945, 76 y.o.   MRN: [id number]  HPI  In August, ordered carotid US due to bruit: IMPRESSION: 1. 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 2. No significant atherosclerotic plaque or evidence of stenosis in the right internal carotid artery. 3. Vertebral arteries are patent with normal antegrade flow.  At her last visit, I ordered a coronary artery calcium score.  She was found to have a score of 90 which is over the 60th percentile in her LAD.  We discussed this today in addition to the plaque seen in her left internal carotid artery.  I explained to the patient that if she is asymptomatic, she does not require any type of surgery or intervention.  We do need to focus on risk factor control.  I would recommend keeping her LDL cholesterol below 70 with a statin and taking an aspirin every day.  Her blood pressure here is elevated but this is whitecoat syndrome.  She is checking her blood pressure every day at home and she brings her meter in for my review.  Her blood pressure at home is excellent averaging between 110 and 130/60-80. Past Medical History:  Diagnosis Date   Hyperlipidemia    11/2010   Hypertension    Osteoporosis    PONV (postoperative nausea and vomiting)    Past Surgical History:  Procedure Laterality Date   BREAST SURGERY     Benign--Bilateral Lumpectomies--Benign   COLONOSCOPY N/A 03/16/2014   Procedure: COLONOSCOPY;  Surgeon: Corbin Ade, MD;  Location: [facility];  Service: Endoscopy;  Laterality: N/A;  2:15 PM   COSMETIC SURGERY  03/08   blepharoplasty   Current Outpatient Medications on File Prior to Visit  Medication Sig Dispense Refill   benazepril (LOTENSIN) 20 MG tablet Take 1 tablet by mouth once daily 90 tablet 3   Cholecalciferol (VITAMIN D) 2000 UNITS tablet Take 2,000 Units by mouth every evening.      Multiple Vitamin (MULTIVITAMIN) tablet Take 1 tablet by mouth every evening. Centrum Silver     rosuvastatin (CRESTOR) 10 MG tablet Take 1 tablet (10 mg total) by mouth daily. 90 tablet 3   No current facility-administered medications on file prior to visit.   No Known Allergies Social History   Socioeconomic History   Marital status: Married    Spouse name: Iantha Fallen   Number of children: 2   Years of education: Not on file   Highest education level: Not on file  Occupational History   Not on file  Tobacco Use   Smoking status: Never   Smokeless tobacco: Never  Substance and Sexual Activity   Alcohol use: Yes    Alcohol/week: 5.0 standard drinks of alcohol    Types: 5 Glasses of wine per week   Drug use: No   Sexual activity: Yes    Birth control/protection: Post-menopausal  Other Topics Concern   Not on file  Social History Narrative   Entered 02/2014:   Married.   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   Works about 16 hours a week now.   Says she "has to stay busy"--in summer works in yard, garden.    Always working on a project in the house etc.    5 grandaughters   Social Determinants of Health  Financial Resource Strain: Low Risk  (03/24/2021)   Overall Financial Resource Strain (CARDIA)    Difficulty of Paying Living Expenses: Not hard at all  Food Insecurity: No Food Insecurity (03/24/2021)   Hunger Vital Sign    Worried About Running Out of Food in the Last Year: Never true    Ran Out of Food in the Last Year: Never true  Transportation Needs: No Transportation Needs (03/24/2021)   PRAPARE - Administrator, Civil Service (Medical): No    Lack of Transportation (Non-Medical): No  Physical Activity: Sufficiently Active (03/24/2021)   Exercise Vital Sign    Days of Exercise per Week: 3 days    Minutes of Exercise per Session: 60 min  Stress: No  Stress Concern Present (03/24/2021)   [employer] of Occupational Health - Occupational Stress Questionnaire    Feeling of Stress : Not at all  Social Connections: Moderately Integrated (03/24/2021)   Social Connection and Isolation Panel [NHANES]    Frequency of Communication with Friends and Family: More than three times a week    Frequency of Social Gatherings with Friends and Family: More than three times a week    Attends Religious Services: Never    Database administrator or Organizations: Yes    Attends Engineer, structural: More than 4 times per year    Marital Status: Married  Catering manager Violence: Not At Risk (03/24/2021)   Humiliation, Afraid, Rape, and Kick questionnaire    Fear of Current or Ex-Partner: No    Emotionally Abused: No    Physically Abused: No    Sexually Abused: No      Review of Systems     Objective:   Physical Exam Vitals reviewed.  Constitutional:      General: She is not in acute distress.    Appearance: Normal appearance. She is normal weight. She is not ill-appearing.  HENT:     Head: Normocephalic and atraumatic.     Right Ear: Tympanic membrane, ear canal and external ear normal. There is no impacted cerumen.     Left Ear: Tympanic membrane, ear canal and external ear normal. There is no impacted cerumen.     Nose: Nose normal. No congestion or rhinorrhea.     Mouth/Throat:     Mouth: Mucous membranes are moist.     Pharynx: No oropharyngeal exudate or posterior oropharyngeal erythema.  Eyes:     Extraocular Movements: Extraocular movements intact.     Conjunctiva/sclera: Conjunctivae normal.     Pupils: Pupils are equal, round, and reactive to light.  Neck:     Vascular: Carotid bruit present.  Cardiovascular:     Rate and Rhythm: Normal rate and regular rhythm.     Pulses: Normal pulses.     Heart sounds: Normal heart sounds. No murmur heard.    No friction rub. No gallop.  Pulmonary:     Effort: Pulmonary effort  is normal. No respiratory distress.     Breath sounds: Normal breath sounds. No stridor. No wheezing, rhonchi or rales.  Chest:     Chest wall: No tenderness.  Abdominal:     General: Bowel sounds are normal. There is no distension.     Palpations: Abdomen is soft. There is no mass.     Tenderness: There is no abdominal tenderness. There is no rebound.  Musculoskeletal:     Cervical back: Neck supple. No rigidity.     Right lower leg: No edema.  Left lower leg: No edema.  Lymphadenopathy:     Cervical: No cervical adenopathy.  Skin:    General: Skin is warm.     Coloration: Skin is not jaundiced or pale.     Findings: No bruising, erythema, lesion or rash.  Neurological:     General: No focal deficit present.     Mental Status: She is alert and oriented to person, place, and time. Mental status is at baseline.     Cranial Nerves: No cranial nerve deficit.     Sensory: No sensory deficit.     Motor: No weakness.     Coordination: Coordination normal.     Gait: Gait normal.     Deep Tendon Reflexes: Reflexes normal.  Psychiatric:        Mood and Affect: Mood normal.        Behavior: Behavior normal.        Thought Content: Thought content normal.        Judgment: Judgment normal.           Assessment & Plan:  Pure hypercholesterolemia - Plan: CBC with Differential/Platelet, Lipid panel, COMPLETE METABOLIC PANEL WITH GFR  Essential hypertension - Plan: CBC with Differential/Platelet, Lipid panel, COMPLETE METABOLIC PANEL WITH GFR  Atherosclerosis of arteries I will check her cholesterol today.  If her LDL cholesterol is less than 70 with her statin, I will make no changes at this time.  Her blood pressure at home is outstanding.  Spent the majority of our visit today discussing atherosclerosis and steps we can take to prevent progression.  Patient is completely asymptomatic.  She is taking an aspirin.

## 2021-11-18 LAB — CBC WITH DIFFERENTIAL/PLATELET
Absolute Monocytes: 551 cells/uL (ref 200–950)
Basophils Absolute: 41 cells/uL (ref 0–200)
Basophils Relative: 0.7 %
Eosinophils Absolute: 139 cells/uL (ref 15–500)
Eosinophils Relative: 2.4 %
HCT: 39.2 % (ref 35.0–45.0)
Hemoglobin: 13.2 g/dL (ref 11.7–15.5)
Lymphs Abs: 1583 cells/uL (ref 850–3900)
MCH: 30 pg (ref 27.0–33.0)
MCHC: 33.7 g/dL (ref 32.0–36.0)
MCV: 89.1 fL (ref 80.0–100.0)
MPV: 10.4 fL (ref 7.5–12.5)
Monocytes Relative: 9.5 %
Neutro Abs: 3486 cells/uL (ref 1500–7800)
Neutrophils Relative %: 60.1 %
Platelets: 206 10*3/uL (ref 140–400)
RBC: 4.4 10*6/uL (ref 3.80–5.10)
RDW: 13 % (ref 11.0–15.0)
Total Lymphocyte: 27.3 %
WBC: 5.8 10*3/uL (ref 3.8–10.8)

## 2021-11-18 LAB — COMPLETE METABOLIC PANEL WITH GFR
AG Ratio: 2.2 (calc) (ref 1.0–2.5)
ALT: 20 U/L (ref 6–29)
AST: 23 U/L (ref 10–35)
Albumin: 4.4 g/dL (ref 3.6–5.1)
Alkaline phosphatase (APISO): 70 U/L (ref 37–153)
BUN: 14 mg/dL (ref 7–25)
CO2: 26 mmol/L (ref 20–32)
Calcium: 9.5 mg/dL (ref 8.6–10.4)
Chloride: 107 mmol/L (ref 98–110)
Creat: 0.9 mg/dL (ref 0.60–1.00)
Globulin: 2 g/dL (calc) (ref 1.9–3.7)
Glucose, Bld: 99 mg/dL (ref 65–99)
Potassium: 4.4 mmol/L (ref 3.5–5.3)
Sodium: 142 mmol/L (ref 135–146)
Total Bilirubin: 0.5 mg/dL (ref 0.2–1.2)
Total Protein: 6.4 g/dL (ref 6.1–8.1)
eGFR: 67 mL/min/{1.73_m2} (ref >=60)

## 2021-11-18 LAB — LIPID PANEL
Cholesterol: 138 mg/dL (ref <200)
HDL: 77 mg/dL (ref >=50)
LDL Cholesterol (Calc): 45 mg/dL (calc)
Non-HDL Cholesterol (Calc): 61 mg/dL (calc) (ref <130)
Total CHOL/HDL Ratio: 1.8 (calc) (ref <5.0)
Triglycerides: 76 mg/dL (ref <150)

## 2022-02-14 ENCOUNTER — Encounter: Payer: Self-pay | Admitting: Family Medicine

## 2022-02-15 ENCOUNTER — Other Ambulatory Visit: Payer: Self-pay | Admitting: Family Medicine

## 2022-02-15 NOTE — Telephone Encounter (Signed)
Requested Prescriptions  Pending Prescriptions Disp Refills  . rosuvastatin (CRESTOR) 10 MG tablet [Pharmacy Med Name: Rosuvastatin Calcium 10 MG Oral Tablet] 90 tablet 0    Sig: Take 1 tablet by mouth once daily     Cardiovascular:  Antilipid - Statins 2 Failed - 02/15/2022  8:43 AM      Failed - Lipid Panel in normal range within the last 12 months    Cholesterol  Date Value Ref Range Status  11/17/2021 138 <200 mg/dL Final   LDL Cholesterol (Calc)  Date Value Ref Range Status  11/17/2021 45 mg/dL (calc) Final    Comment:    Reference range: <100 . Desirable range <100 mg/dL for primary prevention;   <70 mg/dL for patients with CHD or diabetic patients  with > or = 2 CHD risk factors. Marland Kitchen LDL-C is now calculated using the Martin-Hopkins  calculation, which is a validated novel method providing  better accuracy than the Friedewald equation in the  estimation of LDL-C.  Cresenciano Genre et al. Annamaria Helling. 8502;774(12): 2061-2068  (http://education.[facility].com/faq/FAQ164)    HDL  Date Value Ref Range Status  11/17/2021 77 > OR = 50 mg/dL Final   Triglycerides  Date Value Ref Range Status  11/17/2021 76 <150 mg/dL Final         Passed - Cr in normal range and within 360 days    Creat  Date Value Ref Range Status  11/17/2021 0.90 0.60 - 1.00 mg/dL Final         Passed - Patient is not pregnant      Passed - Valid encounter within last 12 months    Recent Outpatient Visits          9 months ago Pure hypercholesterolemia   [place] Pickard, Cammie Mcgee, MD   1 year ago Pure hypercholesterolemia   [place] Pickard, Cammie Mcgee, MD   1 year ago Pure hypercholesterolemia   [place] Pickard, Cammie Mcgee, MD   2 years ago Pure hypercholesterolemia   [place] Pickard, Cammie Mcgee, MD   2 years ago Pure hypercholesterolemia   [place], Cammie Mcgee, MD      Future Appointments             In 3 months Pickard, Cammie Mcgee, MD [place]

## 2022-04-19 ENCOUNTER — Ambulatory Visit (INDEPENDENT_AMBULATORY_CARE_PROVIDER_SITE_OTHER): Payer: PPO

## 2022-04-19 VITALS — Ht 64.0 in | Wt 155.0 lb

## 2022-04-19 DIAGNOSIS — Z780 Asymptomatic menopausal state: Secondary | ICD-10-CM | POA: Diagnosis not present

## 2022-04-19 DIAGNOSIS — Z0000 Encounter for general adult medical examination without abnormal findings: Secondary | ICD-10-CM | POA: Diagnosis not present

## 2022-04-19 NOTE — Progress Notes (Signed)
Subjective:   Sarah Williams is a 77 y.o. female who presents for Medicare Annual (Subsequent) preventive examination. Virtual Visit via Telephone Note  I connected with  Sarah Williams on 04/19/22 at  8:30 AM EST by telephone and verified that I am speaking with the correct person using two identifiers.  Location: Patient: HOME  Provider: [facility] Persons participating in the virtual visit: patient/Nurse Health Advisor   I discussed the limitations, risks, security and privacy concerns of performing an evaluation and management service by telephone and the availability of in person appointments. The patient expressed understanding and agreed to proceed.  Interactive audio and video telecommunications were attempted between this nurse and patient, however failed, due to patient having technical difficulties OR patient did not have access to video capability.  We continued and completed visit with audio only.  Some vital signs may be absent or patient reported.   Sarah Dash, LPN  Review of Systems     Cardiac Risk Factors include: advanced age (>7men, >33 women);hypertension;dyslipidemia     Objective:    Today's Vitals   04/19/22 0827  Weight: 155 lb (70.3 kg)  Height: 5\' 4"  (1.626 m)   Body mass index is 26.61 kg/m.     04/19/2022    8:32 AM 03/24/2021    8:19 AM 03/16/2014    1:43 PM  Advanced Directives  Does Patient Have a Medical Advance Directive? Yes Yes No;Yes  Type of 03/18/2014 of [place];Living will Living will;Healthcare Power of Attorney Living will  Copy of Healthcare Power of Attorney in Chart? No - copy requested No - copy requested     Current Medications (verified) Outpatient Encounter Medications as of 04/19/2022  Medication Sig   benazepril (LOTENSIN) 20 MG tablet Take 1 tablet by mouth once daily   Cholecalciferol (VITAMIN D) 2000 UNITS tablet Take 2,000 Units by mouth every evening.   Multiple Vitamin  (MULTIVITAMIN) tablet Take 1 tablet by mouth every evening. Centrum Silver   rosuvastatin (CRESTOR) 10 MG tablet Take 1 tablet by mouth once daily   No facility-administered encounter medications on file as of 04/19/2022.    Allergies (verified) Patient has no known allergies.   History: Past Medical History:  Diagnosis Date   Hyperlipidemia    11/2010   Hypertension    Osteoporosis    PONV (postoperative nausea and vomiting)    Past Surgical History:  Procedure Laterality Date   BREAST SURGERY     Benign--Bilateral Lumpectomies--Benign   COLONOSCOPY N/A 03/16/2014   Procedure: COLONOSCOPY;  Surgeon: 03/18/2014, MD;  Location: [facility];  Service: Endoscopy;  Laterality: N/A;  2:15 PM   COSMETIC SURGERY  03/08   blepharoplasty   Family History  Problem Relation Age of Onset   Hypertension Father    Kidney disease Father    Stroke Father    Heart disease Mother 28       CAD   Heart disease Paternal Grandfather 53       CAD, MI   Social History   Socioeconomic History   Marital status: Married    Spouse name: 78   Number of children: 2   Years of education: Not on file   Highest education level: Not on file  Occupational History   Not on file  Tobacco Use   Smoking status: Never   Smokeless tobacco: Never  Substance and Sexual Activity   Alcohol use: Yes    Alcohol/week: 5.0 standard  drinks of alcohol    Types: 5 Glasses of wine per week   Drug use: No   Sexual activity: Yes    Birth control/protection: Post-menopausal  Other Topics Concern   Not on file  Social History Narrative   Entered 02/2014:   Married.   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   Works about 16 hours a week now.   Says she "has to stay busy"--in summer works in yard, garden.    Always working on a project in the house etc.    5 grandaughters   Social Determinants of  Health   Financial Resource Strain: Low Risk  (04/19/2022)   Overall Financial Resource Strain (CARDIA)    Difficulty of Paying Living Expenses: Not hard at all  Food Insecurity: No Food Insecurity (04/19/2022)   Hunger Vital Sign    Worried About Running Out of Food in the Last Year: Never true    Ran Out of Food in the Last Year: Never true  Transportation Needs: No Transportation Needs (04/19/2022)   PRAPARE - Hydrologist (Medical): No    Lack of Transportation (Non-Medical): No  Physical Activity: Sufficiently Active (04/19/2022)   Exercise Vital Sign    Days of Exercise per Week: 3 days    Minutes of Exercise per Session: 60 min  Stress: No Stress Concern Present (04/19/2022)   [place]    Feeling of Stress : Not at all  Social Connections: [place] (04/19/2022)   Social Connection and Isolation Panel [NHANES]    Frequency of Communication with Friends and Family: More than three times a week    Frequency of Social Gatherings with Friends and Family: More than three times a week    Attends Religious Services: More than 4 times per year    Active Member of [employer] or Organizations: Yes    Attends Music therapist: More than 4 times per year    Marital Status: Married    Tobacco Counseling Counseling given: Not Answered   Clinical Intake:  Pre-visit preparation completed: Yes  Pain : No/denies pain     BMI - recorded: 26.61 Nutritional Status: BMI 25 -29 Overweight Nutritional Risks: None Diabetes: No  How often do you need to have someone help you when you read instructions, pamphlets, or other written materials from your doctor or pharmacy?: 1 - Never  Diabetic?NO  Interpreter Needed?: No  Information entered by :: mj Sarah Gloor, lpn   Activities of Daily Living    04/19/2022    8:33 AM  In your present state of health, do you have any difficulty  performing the following activities:  Hearing? 0  Vision? 0  Difficulty concentrating or making decisions? 0  Walking or climbing stairs? 0  Dressing or bathing? 0  Doing errands, shopping? 0  Preparing Food and eating ? N  Using the Toilet? N  In the past six months, have you accidently leaked urine? N  Do you have problems with loss of bowel control? N  Managing your Medications? N  Managing your Finances? N  Housekeeping or managing your Housekeeping? N    Patient Care Team: Susy Frizzle, MD as PCP - General (Family Medicine)  Indicate any recent Medical Services you may have received from other than [facility] providers in the past year (date may be  approximate).     Assessment:   This is a routine wellness examination for Brittanni.  Hearing/Vision screen Hearing Screening - Comments:: No hearing issues.  Vision Screening - Comments:: Glasses. Dr. Jorja Loa at [facility]  Dietary issues and exercise activities discussed: Current Exercise Habits: Structured exercise class, Type of exercise: strength training/weights;treadmill;walking;stretching, Time (Minutes): 60, Frequency (Times/Week): 3, Weekly Exercise (Minutes/Week): 180, Intensity: Moderate, Exercise limited by: cardiac condition(s)   Goals Addressed             This Visit's Progress    Exercise 3x per week (30 min per time)   On track    Continue to exercise regularly. Spend more time with family.       Depression Screen    04/19/2022    8:30 AM 03/24/2021    8:15 AM 05/24/2020    8:09 AM 11/04/2019    8:07 AM 10/28/2018    8:36 AM 04/29/2018    8:11 AM 10/22/2017    8:10 AM  PHQ 2/9 Scores  PHQ - 2 Score 0 0 0 0 0 0 0    Fall Risk    04/19/2022    8:33 AM 03/24/2021    8:20 AM 05/24/2020    8:09 AM 11/04/2019    8:06 AM 05/02/2019    8:08 AM  Fall Risk   Falls in the past year? 0 0 0 0 0  Number falls in past yr: 0 0     Injury with Fall? 0 0     Risk for fall due to : No Fall Risks No Fall Risks No Fall  Risks No Fall Risks   Follow up Falls prevention discussed Falls prevention discussed Falls evaluation completed Falls evaluation completed Falls evaluation completed    FALL RISK PREVENTION PERTAINING TO THE HOME:  Any stairs in or around the home? No  If so, are there any without handrails? No  Home free of loose throw rugs in walkways, pet beds, electrical cords, etc? Yes  Adequate lighting in your home to reduce risk of falls? Yes   ASSISTIVE DEVICES UTILIZED TO PREVENT FALLS:  Life alert? No  Use of a cane, walker or w/c? No  Grab bars in the bathroom? No  Shower chair or bench in shower? Yes  Elevated toilet seat or a handicapped toilet? Yes   TIMED UP AND GO:  Was the test performed? No .  Phone visit  Cognitive Function:        04/19/2022    8:34 AM 03/24/2021    8:23 AM  6CIT Screen  What Year? 0 points 0 points  What month? 0 points 0 points  What time? 0 points 0 points  Count back from 20 0 points 0 points  Months in reverse 0 points 0 points  Repeat phrase 0 points 0 points  Total Score 0 points 0 points    Immunizations Immunization History  Administered Date(s) Administered   Influenza, High Dose Seasonal PF 02/13/2018, 02/10/2021   Influenza,inj,Quad PF,6+ Mos 02/18/2014, 04/08/2015, 04/13/2016, 02/14/2019   Influenza-Unspecified 02/13/2018, 04/01/2020, 02/13/2022   Pneumococcal Conjugate-13 02/18/2014   Pneumococcal Polysaccharide-23 10/07/2014   Tdap 06/18/2007   Zoster, Live 10/10/2014    TDAP status: Due, Education has been provided regarding the importance of this vaccine. Advised may receive this vaccine at local pharmacy or Health Dept. Aware to provide a copy of the vaccination record if obtained from local pharmacy or Health Dept. Verbalized acceptance and understanding.  Flu Vaccine status: Up to  date  Pneumococcal vaccine status: Up to date  Covid-19 vaccine status: Completed vaccines  Qualifies for Shingles Vaccine? Yes   Zostavax  completed Yes   Shingrix Completed?: No.    Education has been provided regarding the importance of this vaccine. Patient has been advised to call insurance company to determine out of pocket expense if they have not yet received this vaccine. Advised may also receive vaccine at local pharmacy or Health Dept. Verbalized acceptance and understanding.  Screening Tests Health Maintenance  Topic Date Due   COVID-19 Vaccine (1) 06/16/2022 (Originally 02/21/1946)   Zoster Vaccines- Shingrix (1 of 2) 07/19/2022 (Originally 08/20/1997)   MAMMOGRAM  10/15/2023 (Originally 10/29/2021)   Medicare Annual Wellness (AWV)  04/20/2023   COLONOSCOPY (Pts 45-74yrs Insurance coverage will need to be confirmed)  03/16/2024   Pneumonia Vaccine 61+ Years old  Completed   INFLUENZA VACCINE  Completed   DEXA SCAN  Completed   Hepatitis C Screening  Completed   HPV VACCINES  Aged Out   DTaP/Tdap/Td  Discontinued    Health Maintenance  There are no preventive care reminders to display for this patient.   Colorectal cancer screening: Type of screening: Colonoscopy. Completed 03/16/2014. Repeat every 10 years  Mammogram status: Completed 10/30/2019. Repeat every year  Bone Density status: Ordered 04/19/2022. Pt provided with contact info and advised to call to schedule appt.  Lung Cancer Screening: (Low Dose CT Chest recommended if Age 65-80 years, 30 pack-year currently smoking OR have quit w/in 15years.) does not qualify.   Lung Cancer Screening Referral: N/A  Additional Screening:  Hepatitis C Screening: does qualify; Completed 04/23/2017  Vision Screening: Recommended annual ophthalmology exams for early detection of glaucoma and other disorders of the eye. Is the patient up to date with their annual eye exam?  Yes  Who is the provider or what is the name of the office in which the patient attends annual eye exams? DR. Judith Part EYE MD If pt is not established with a provider, would they like to be referred  to a provider to establish care? No .   Dental Screening: Recommended annual dental exams for proper oral hygiene  Community Resource Referral / Chronic Care Management: CRR required this visit?  No   CCM required this visit?  No      Plan:     I have personally reviewed and noted the following in the patient's chart:   Medical and social history Use of alcohol, tobacco or illicit drugs  Current medications and supplements including opioid prescriptions. Patient is not currently taking opioid prescriptions. Functional ability and status Nutritional status Physical activity Advanced directives List of other physicians Hospitalizations, surgeries, and ER visits in previous 12 months Vitals Screenings to include cognitive, depression, and falls Referrals and appointments  In addition, I have reviewed and discussed with patient certain preventive protocols, quality metrics, and best practice recommendations. A written personalized care plan for preventive services as well as general preventive health recommendations were provided to patient.     Sarah Dash, LPN   4/0/9811   Nurse Notes: Pt is up to date on vaccines. Discussed Tdap and how to obtain. Order placed for Bone Density. Pt states she will call to set up Mammogram.

## 2022-04-19 NOTE — Patient Instructions (Signed)
[name] , Thank you for taking time to come for your Medicare Wellness Visit. I appreciate your ongoing commitment to your health goals. Please review the following plan we discussed and let me know if I can assist you in the future.   These are the goals we discussed:  Goals      Exercise 3x per week (30 min per time)     Continue to exercise regularly. Spend more time with family.        This is a list of the screening recommended for you and due dates:  Health Maintenance  Topic Date Due   COVID-19 Vaccine (1) 06/16/2022*   Zoster (Shingles) Vaccine (1 of 2) 07/19/2022*   Mammogram  10/15/2023*   Medicare Annual Wellness Visit  04/20/2023   Colon Cancer Screening  03/16/2024   Pneumonia Vaccine  Completed   Flu Shot  Completed   DEXA scan (bone density measurement)  Completed   Hepatitis C Screening: USPSTF Recommendation to screen - Ages 54-79 yo.  Completed   HPV Vaccine  Aged Out   DTaP/Tdap/Td vaccine  Discontinued  *Topic was postponed. The date shown is not the original due date.    Advanced directives: Please bring a copy of your health care power of attorney and living will to the office to be added to your chart at your convenience.   Conditions/risks identified: KEEP UP THE GOOD WORK!!  Next appointment: Follow up in one year for your annual wellness visit 04/2023.   Preventive Care 37 Years and Older, Female Preventive care refers to lifestyle choices and visits with your health care provider that can promote health and wellness. What does preventive care include? A yearly physical exam. This is also called an annual well check. Dental exams once or twice a year. Routine eye exams. Ask your health care provider how often you should have your eyes checked. Personal lifestyle choices, including: Daily care of your teeth and gums. Regular physical activity. Eating a healthy diet. Avoiding tobacco and drug use. Limiting alcohol use. Practicing safe  sex. Taking low-dose aspirin every day. Taking vitamin and mineral supplements as recommended by your health care provider. What happens during an annual well check? The services and screenings done by your health care provider during your annual well check will depend on your age, overall health, lifestyle risk factors, and family history of disease. Counseling  Your health care provider may ask you questions about your: Alcohol use. Tobacco use. Drug use. Emotional well-being. Home and relationship well-being. Sexual activity. Eating habits. History of falls. Memory and ability to understand (cognition). Work and work Statistician. Reproductive health. Screening  You may have the following tests or measurements: Height, weight, and BMI. Blood pressure. Lipid and cholesterol levels. These may be checked every 5 years, or more frequently if you are over 67 years old. Skin check. Lung cancer screening. You may have this screening every year starting at age 54 if you have a 30-pack-year history of smoking and currently smoke or have quit within the past 15 years. Fecal occult blood test (FOBT) of the stool. You may have this test every year starting at age 19. Flexible sigmoidoscopy or colonoscopy. You may have a sigmoidoscopy every 5 years or a colonoscopy every 10 years starting at age 27. Hepatitis C blood test. Hepatitis B blood test. Sexually transmitted disease (STD) testing. Diabetes screening. This is done by checking your blood sugar (glucose) after you have not eaten for a while (fasting). You may  have this done every 1-3 years. Bone density scan. This is done to screen for osteoporosis. You may have this done starting at age 69. Mammogram. This may be done every 1-2 years. Talk to your health care provider about how often you should have regular mammograms. Talk with your health care provider about your test results, treatment options, and if necessary, the need for more  tests. Vaccines  Your health care provider may recommend certain vaccines, such as: Influenza vaccine. This is recommended every year. Tetanus, diphtheria, and acellular pertussis (Tdap, Td) vaccine. You may need a Td booster every 10 years. Zoster vaccine. You may need this after age 63. Pneumococcal 13-valent conjugate (PCV13) vaccine. One dose is recommended after age 12. Pneumococcal polysaccharide (PPSV23) vaccine. One dose is recommended after age 50. Talk to your health care provider about which screenings and vaccines you need and how often you need them. This information is not intended to replace advice given to you by your health care provider. Make sure you discuss any questions you have with your health care provider. Document Released: 04/30/2015 Document Revised: 12/22/2015 Document Reviewed: 02/02/2015 Elsevier Interactive Patient Education  2017 [place] Prevention in the Home Falls can cause injuries. They can happen to people of all ages. There are many things you can do to make your home safe and to help prevent falls. What can I do on the outside of my home? Regularly fix the edges of walkways and driveways and fix any cracks. Remove anything that might make you trip as you walk through a door, such as a raised step or threshold. Trim any bushes or trees on the path to your home. Use bright outdoor lighting. Clear any walking paths of anything that might make someone trip, such as rocks or tools. Regularly check to see if handrails are loose or broken. Make sure that both sides of any steps have handrails. Any raised decks and porches should have guardrails on the edges. Have any leaves, snow, or ice cleared regularly. Use sand or salt on walking paths during winter. Clean up any spills in your garage right away. This includes oil or grease spills. What can I do in the bathroom? Use night lights. Install grab bars by the toilet and in the tub and shower.  Do not use towel bars as grab bars. Use non-skid mats or decals in the tub or shower. If you need to sit down in the shower, use a plastic, non-slip stool. Keep the floor dry. Clean up any water that spills on the floor as soon as it happens. Remove soap buildup in the tub or shower regularly. Attach bath mats securely with double-sided non-slip rug tape. Do not have throw rugs and other things on the floor that can make you trip. What can I do in the bedroom? Use night lights. Make sure that you have a light by your bed that is easy to reach. Do not use any sheets or blankets that are too big for your bed. They should not hang down onto the floor. Have a firm chair that has side arms. You can use this for support while you get dressed. Do not have throw rugs and other things on the floor that can make you trip. What can I do in the kitchen? Clean up any spills right away. Avoid walking on wet floors. Keep items that you use a lot in easy-to-reach places. If you need to reach something above you, use a strong step  stool that has a grab bar. Keep electrical cords out of the way. Do not use floor polish or wax that makes floors slippery. If you must use wax, use non-skid floor wax. Do not have throw rugs and other things on the floor that can make you trip. What can I do with my stairs? Do not leave any items on the stairs. Make sure that there are handrails on both sides of the stairs and use them. Fix handrails that are broken or loose. Make sure that handrails are as long as the stairways. Check any carpeting to make sure that it is firmly attached to the stairs. Fix any carpet that is loose or worn. Avoid having throw rugs at the top or bottom of the stairs. If you do have throw rugs, attach them to the floor with carpet tape. Make sure that you have a light switch at the top of the stairs and the bottom of the stairs. If you do not have them, ask someone to add them for you. What else  can I do to help prevent falls? Wear shoes that: Do not have high heels. Have rubber bottoms. Are comfortable and fit you well. Are closed at the toe. Do not wear sandals. If you use a stepladder: Make sure that it is fully opened. Do not climb a closed stepladder. Make sure that both sides of the stepladder are locked into place. Ask someone to hold it for you, if possible. Clearly mark and make sure that you can see: Any grab bars or handrails. First and last steps. Where the edge of each step is. Use tools that help you move around (mobility aids) if they are needed. These include: Canes. Walkers. Scooters. Crutches. Turn on the lights when you go into a dark area. Replace any light bulbs as soon as they burn out. Set up your furniture so you have a clear path. Avoid moving your furniture around. If any of your floors are uneven, fix them. If there are any pets around you, be aware of where they are. Review your medicines with your doctor. Some medicines can make you feel dizzy. This can increase your chance of falling. Ask your doctor what other things that you can do to help prevent falls. This information is not intended to replace advice given to you by your health care provider. Make sure you discuss any questions you have with your health care provider. Document Released: 01/28/2009 Document Revised: 09/09/2015 Document Reviewed: 05/08/2014 Elsevier Interactive Patient Education  2017 [employer].

## 2022-05-08 ENCOUNTER — Other Ambulatory Visit: Payer: Self-pay | Admitting: Family Medicine

## 2022-05-08 DIAGNOSIS — Z1231 Encounter for screening mammogram for malignant neoplasm of breast: Secondary | ICD-10-CM

## 2022-05-10 ENCOUNTER — Other Ambulatory Visit: Payer: Self-pay | Admitting: Family Medicine

## 2022-05-10 ENCOUNTER — Ambulatory Visit
Admission: RE | Admit: 2022-05-10 | Discharge: 2022-05-10 | Disposition: A | Discharge status: Home / Self Care | Payer: PPO | Source: Ambulatory Visit | Attending: Family Medicine | Admitting: Family Medicine

## 2022-05-10 DIAGNOSIS — Z780 Asymptomatic menopausal state: Secondary | ICD-10-CM | POA: Diagnosis not present

## 2022-05-10 DIAGNOSIS — Z0000 Encounter for general adult medical examination without abnormal findings: Secondary | ICD-10-CM

## 2022-05-10 DIAGNOSIS — M8589 Other specified disorders of bone density and structure, multiple sites: Secondary | ICD-10-CM | POA: Diagnosis not present

## 2022-05-10 DIAGNOSIS — Z1231 Encounter for screening mammogram for malignant neoplasm of breast: Secondary | ICD-10-CM | POA: Diagnosis not present

## 2022-05-10 NOTE — Telephone Encounter (Signed)
Requested Prescriptions  Pending Prescriptions Disp Refills   rosuvastatin (CRESTOR) 10 MG tablet [Pharmacy Med Name: Rosuvastatin Calcium 10 MG Oral Tablet] 90 tablet 0    Sig: Take 1 tablet by mouth once daily     Cardiovascular:  Antilipid - Statins 2 Failed - 05/10/2022  1:10 PM      Failed - Lipid Panel in normal range within the last 12 months    Cholesterol  Date Value Ref Range Status  11/17/2021 138 <200 mg/dL Final   LDL Cholesterol (Calc)  Date Value Ref Range Status  11/17/2021 45 mg/dL (calc) Final    Comment:    Reference range: <100 . Desirable range <100 mg/dL for primary prevention;   <70 mg/dL for patients with CHD or diabetic patients  with > or = 2 CHD risk factors. Marland Kitchen LDL-C is now calculated using the Martin-Hopkins  calculation, which is a validated novel method providing  better accuracy than the Friedewald equation in the  estimation of LDL-C.  Cresenciano Genre et al. Annamaria Helling. 2355;732(20): 2061-2068  (http://education.[facility].com/faq/FAQ164)    HDL  Date Value Ref Range Status  11/17/2021 77 > OR = 50 mg/dL Final   Triglycerides  Date Value Ref Range Status  11/17/2021 76 <150 mg/dL Final         Passed - Cr in normal range and within 360 days    Creat  Date Value Ref Range Status  11/17/2021 0.90 0.60 - 1.00 mg/dL Final         Passed - Patient is not pregnant      Passed - Valid encounter within last 12 months    Recent Outpatient Visits           11 months ago Pure hypercholesterolemia   [place] Pickard, Cammie Mcgee, MD   1 year ago Pure hypercholesterolemia   [place] Pickard, Cammie Mcgee, MD   1 year ago Pure hypercholesterolemia   [place] Pickard, Cammie Mcgee, MD   2 years ago Pure hypercholesterolemia   [place] Pickard, Cammie Mcgee, MD   3 years ago Pure hypercholesterolemia   [place], Cammie Mcgee, MD       Future Appointments              In 1 week Pickard, Cammie Mcgee, MD [place]

## 2022-05-15 ENCOUNTER — Other Ambulatory Visit (HOSPITAL_COMMUNITY): Payer: PPO

## 2022-05-23 ENCOUNTER — Ambulatory Visit (INDEPENDENT_AMBULATORY_CARE_PROVIDER_SITE_OTHER): Payer: PPO | Admitting: Family Medicine

## 2022-05-23 ENCOUNTER — Encounter: Payer: Self-pay | Admitting: Family Medicine

## 2022-05-23 VITALS — BP 144/82 | HR 76 | Temp 98.7°F | Ht 64.0 in | Wt 161.0 lb

## 2022-05-23 DIAGNOSIS — I7090 Unspecified atherosclerosis: Secondary | ICD-10-CM | POA: Diagnosis not present

## 2022-05-23 DIAGNOSIS — I10 Essential (primary) hypertension: Secondary | ICD-10-CM

## 2022-05-23 DIAGNOSIS — E7800 Pure hypercholesterolemia, unspecified: Secondary | ICD-10-CM | POA: Diagnosis not present

## 2022-05-23 NOTE — Progress Notes (Signed)
Subjective:    Patient ID: Sarah Williams, female    DOB: Feb 27, 1946, 77 y.o.   MRN: [id number]  HPI  In August, ordered carotid US due to bruit: IMPRESSION: 1. 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 2. No significant atherosclerotic plaque or evidence of stenosis in the right internal carotid artery. 3. Vertebral arteries are patent with normal antegrade flow. She was also found to have plaque on a coronary artery calcium score.  She was found to have a score of 90 which is over the 60th percentile in her LAD.    Patient is doing well.  She denies any chest pain shortness of breath or dyspnea on exertion.  Blood pressure is borderline elevated here however she has a history of whitecoat syndrome.  It has been over a year since we checked her carotid arteries.  She denies any strokelike symptoms.  She is taking her aspirin and her statin every day.  Past Medical History:  Diagnosis Date   Hyperlipidemia    11/2010   Hypertension    Osteoporosis    PONV (postoperative nausea and vomiting)    Past Surgical History:  Procedure Laterality Date   BREAST CYST ASPIRATION Bilateral    BREAST SURGERY     Benign--Bilateral Lumpectomies--Benign   COLONOSCOPY N/A 03/16/2014   Procedure: COLONOSCOPY;  Surgeon: Daneil Dolin, MD;  [facility];  Service: Endoscopy;  Laterality: N/A;  2:15 PM   COSMETIC SURGERY  06/16/2006   blepharoplasty   Current Outpatient Medications on File Prior to Visit  Medication Sig Dispense Refill   benazepril (LOTENSIN) 20 MG tablet Take 1 tablet by mouth once daily 90 tablet 3   Cholecalciferol (VITAMIN D) 2000 UNITS tablet Take 2,000 Units by mouth every evening.     Multiple Vitamin (MULTIVITAMIN) tablet Take 1 tablet by mouth every evening. Centrum Silver     rosuvastatin (CRESTOR) 10 MG tablet Take 1 tablet by mouth once daily 90 tablet 0   No current  facility-administered medications on file prior to visit.   No Known Allergies Social History   Socioeconomic History   Marital status: Married    Spouse name: Chrissie Noa   Number of children: 2   Years of education: Not on file   Highest education level: Not on file  Occupational History   Not on file  Tobacco Use   Smoking status: Never   Smokeless tobacco: Never  Substance and Sexual Activity   Alcohol use: Yes    Alcohol/week: 5.0 standard drinks of alcohol    Types: 5 Glasses of wine per week   Drug use: No   Sexual activity: Yes    Birth control/protection: Post-menopausal  Other Topics Concern   Not on file  Social History Narrative   Entered 02/2014:   Married.   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   Works about 16 hours a week now.   Says she "has to stay busy"--in summer works in yard, garden.    Always working on a project in the house etc.    5 grandaughters   Social Determinants of Health   Financial Resource Strain: Low Risk  (04/19/2022)   Overall Financial Resource Strain (CARDIA)    Difficulty of Paying Living Expenses: Not hard at all  Food Insecurity: No Food Insecurity (04/19/2022)   Hunger Vital Sign  Worried About Charity fundraiser in the Last Year: Never true    Sunrise in the Last Year: Never true  Transportation Needs: No Transportation Needs (04/19/2022)   PRAPARE - Hydrologist (Medical): No    Lack of Transportation (Non-Medical): No  Physical Activity: Sufficiently Active (04/19/2022)   Exercise Vital Sign    Days of Exercise per Week: 3 days    Minutes of Exercise per Session: 60 min  Stress: No Stress Concern Present (04/19/2022)   [place]    Feeling of Stress : Not at all  Social Connections: [place] (04/19/2022)   Social  Connection and Isolation Panel [NHANES]    Frequency of Communication with Friends and Family: More than three times a week    Frequency of Social Gatherings with Friends and Family: More than three times a week    Attends Religious Services: More than 4 times per year    Active Member of [employer] or Organizations: Yes    Attends Archivist Meetings: More than 4 times per year    Marital Status: Married  Human resources officer Violence: Not At Risk (04/19/2022)   Humiliation, Afraid, Rape, and Kick questionnaire    Fear of Current or Ex-Partner: No    Emotionally Abused: No    Physically Abused: No    Sexually Abused: No      Review of Systems     Objective:   Physical Exam Vitals reviewed.  Constitutional:      General: She is not in acute distress.    Appearance: Normal appearance. She is normal weight. She is not ill-appearing.  HENT:     Head: Normocephalic and atraumatic.     Right Ear: Tympanic membrane, ear canal and external ear normal. There is no impacted cerumen.     Left Ear: Tympanic membrane, ear canal and external ear normal. There is no impacted cerumen.     Nose: Nose normal. No congestion or rhinorrhea.     Mouth/Throat:     Mouth: Mucous membranes are moist.     Pharynx: No oropharyngeal exudate or posterior oropharyngeal erythema.  Eyes:     Extraocular Movements: Extraocular movements intact.     Conjunctiva/sclera: Conjunctivae normal.     Pupils: Pupils are equal, round, and reactive to light.  Neck:     Vascular: Carotid bruit present.  Cardiovascular:     Rate and Rhythm: Normal rate and regular rhythm.     Pulses: Normal pulses.     Heart sounds: Normal heart sounds. No murmur heard.    No friction rub. No gallop.  Pulmonary:     Effort: Pulmonary effort is normal. No respiratory distress.     Breath sounds: Normal breath sounds. No stridor. No wheezing, rhonchi or rales.  Chest:     Chest wall: No tenderness.  Abdominal:     General: Bowel  sounds are normal. There is no distension.     Palpations: Abdomen is soft. There is no mass.     Tenderness: There is no abdominal tenderness. There is no rebound.  Musculoskeletal:     Cervical back: Neck supple. No rigidity.     Right lower leg: No edema.     Left lower leg: No edema.  Lymphadenopathy:     Cervical: No cervical adenopathy.  Skin:    General: Skin is warm.     Coloration: Skin is not jaundiced or  pale.     Findings: No bruising, erythema, lesion or rash.  Neurological:     General: No focal deficit present.     Mental Status: She is alert and oriented to person, place, and time. Mental status is at baseline.     Cranial Nerves: No cranial nerve deficit.     Sensory: No sensory deficit.     Motor: No weakness.     Coordination: Coordination normal.     Gait: Gait normal.     Deep Tendon Reflexes: Reflexes normal.  Psychiatric:        Mood and Affect: Mood normal.        Behavior: Behavior normal.        Thought Content: Thought content normal.        Judgment: Judgment normal.           Assessment & Plan:  Essential hypertension - Plan: COMPLETE METABOLIC PANEL WITH GFR, Lipid panel  Atherosclerosis of arteries - Plan: COMPLETE METABOLIC PANEL WITH GFR, Lipid panel, US Carotid Duplex Bilateral  Pure hypercholesterolemia - Plan: COMPLETE METABOLIC PANEL WITH GFR, Lipid panel Check fasting lipid panel.  Goal LDL cholesterol is less than 70.  Continues take aspirin.  Blood pressure is borderline today.  I will ask her to check her blood pressure every day and ensure that we keep her systolic blood pressure less than 140

## 2022-05-24 LAB — COMPLETE METABOLIC PANEL WITH GFR
AG Ratio: 1.9 (calc) (ref 1.0–2.5)
ALT: 19 U/L (ref 6–29)
AST: 23 U/L (ref 10–35)
Albumin: 4.3 g/dL (ref 3.6–5.1)
Alkaline phosphatase (APISO): 70 U/L (ref 37–153)
BUN: 21 mg/dL (ref 7–25)
CO2: 28 mmol/L (ref 20–32)
Calcium: 9.5 mg/dL (ref 8.6–10.4)
Chloride: 106 mmol/L (ref 98–110)
Creat: 0.8 mg/dL (ref 0.60–1.00)
Globulin: 2.3 g/dL (calc) (ref 1.9–3.7)
Glucose, Bld: 105 mg/dL — ABNORMAL HIGH (ref 65–99)
Potassium: 4.4 mmol/L (ref 3.5–5.3)
Sodium: 143 mmol/L (ref 135–146)
Total Bilirubin: 0.4 mg/dL (ref 0.2–1.2)
Total Protein: 6.6 g/dL (ref 6.1–8.1)
eGFR: 77 mL/min/{1.73_m2} (ref >=60)

## 2022-05-24 LAB — LIPID PANEL
Cholesterol: 152 mg/dL (ref <200)
HDL: 83 mg/dL (ref >=50)
LDL Cholesterol (Calc): 52 mg/dL (calc)
Non-HDL Cholesterol (Calc): 69 mg/dL (calc) (ref <130)
Total CHOL/HDL Ratio: 1.8 (calc) (ref <5.0)
Triglycerides: 88 mg/dL (ref <150)

## 2022-07-04 ENCOUNTER — Ambulatory Visit (HOSPITAL_COMMUNITY)
Admission: RE | Admit: 2022-07-04 | Discharge: 2022-07-04 | Disposition: A | Discharge status: Home / Self Care | Payer: PPO | Source: Ambulatory Visit | Attending: Family Medicine | Admitting: Family Medicine

## 2022-07-04 DIAGNOSIS — I7090 Unspecified atherosclerosis: Secondary | ICD-10-CM | POA: Diagnosis not present

## 2022-07-04 DIAGNOSIS — I6523 Occlusion and stenosis of bilateral carotid arteries: Secondary | ICD-10-CM | POA: Diagnosis not present

## 2022-08-06 ENCOUNTER — Other Ambulatory Visit: Payer: Self-pay | Admitting: Family Medicine

## 2022-11-03 ENCOUNTER — Other Ambulatory Visit: Payer: Self-pay | Admitting: Family Medicine

## 2022-11-03 NOTE — Telephone Encounter (Signed)
Requested Prescriptions  Pending Prescriptions Disp Refills   rosuvastatin (CRESTOR) 10 MG tablet [Pharmacy Med Name: Rosuvastatin Calcium 10 MG Oral Tablet] 90 tablet 1    Sig: Take 1 tablet by mouth once daily     Cardiovascular:  Antilipid - Statins 2 Failed - 11/03/2022 11:18 AM      Failed - Valid encounter within last 12 months    Recent Outpatient Visits           1 year ago Pure hypercholesterolemia   [facility] Pickard, Priscille Heidelberg, MD   1 year ago Pure hypercholesterolemia   [facility] Pickard, Priscille Heidelberg, MD   2 years ago Pure hypercholesterolemia   [facility] Pickard, Priscille Heidelberg, MD   3 years ago Pure hypercholesterolemia   [facility] Pickard, Priscille Heidelberg, MD   3 years ago Pure hypercholesterolemia   [facility] Pickard, Priscille Heidelberg, MD              Failed - Lipid Panel in normal range within the last 12 months    Cholesterol  Date Value Ref Range Status  05/23/2022 152 <200 mg/dL Final   LDL Cholesterol (Calc)  Date Value Ref Range Status  05/23/2022 52 mg/dL (calc) Final    Comment:    Reference range: <100 . Desirable range <100 mg/dL for primary prevention;   <70 mg/dL for patients with CHD or diabetic patients  with > or = 2 CHD risk factors. Marland Kitchen LDL-C is now calculated using the Martin-Hopkins  calculation, which is a validated novel method providing  better accuracy than the Friedewald equation in the  estimation of LDL-C.  Horald Pollen et al. Lenox Ahr. 1610;960(45): 2061-2068  (http://education.[facility].com/faq/FAQ164)    HDL  Date Value Ref Range Status  05/23/2022 83 > OR = 50 mg/dL Final   Triglycerides  Date Value Ref Range Status  05/23/2022 88 <150 mg/dL Final         Passed - Cr in normal range and within 360 days    Creat  Date Value Ref Range Status  05/23/2022 0.80 0.60 - 1.00 mg/dL Final         Passed - Patient is not pregnant

## 2022-11-04 ENCOUNTER — Other Ambulatory Visit: Payer: Self-pay | Admitting: Family Medicine

## 2022-11-04 DIAGNOSIS — I10 Essential (primary) hypertension: Secondary | ICD-10-CM

## 2022-11-06 NOTE — Telephone Encounter (Signed)
Requested Prescriptions  Pending Prescriptions Disp Refills   benazepril (LOTENSIN) 20 MG tablet [Pharmacy Med Name: Benazepril HCl 20 MG Oral Tablet] 90 tablet 0    Sig: Take 1 tablet by mouth once daily     Cardiovascular:  ACE Inhibitors Failed - 11/04/2022  6:50 AM      Failed - Last BP in normal range    BP Readings from Last 1 Encounters:  05/23/22 (!) 144/82         Failed - Valid encounter within last 6 months    Recent Outpatient Visits           1 year ago Pure hypercholesterolemia   [facility] Pickard, Priscille Heidelberg, MD   1 year ago Pure hypercholesterolemia   [facility] Pickard, Priscille Heidelberg, MD   2 years ago Pure hypercholesterolemia   [facility] Pickard, Priscille Heidelberg, MD   3 years ago Pure hypercholesterolemia   [facility] Pickard, Priscille Heidelberg, MD   3 years ago Pure hypercholesterolemia   [facility] Pickard, Priscille Heidelberg, MD              Passed - Cr in normal range and within 180 days    Creat  Date Value Ref Range Status  05/23/2022 0.80 0.60 - 1.00 mg/dL Final         Passed - K in normal range and within 180 days    Potassium  Date Value Ref Range Status  05/23/2022 4.4 3.5 - 5.3 mmol/L Final         Passed - Patient is not pregnant

## 2022-11-28 ENCOUNTER — Ambulatory Visit (INDEPENDENT_AMBULATORY_CARE_PROVIDER_SITE_OTHER): Payer: PPO | Admitting: Family Medicine

## 2022-11-28 ENCOUNTER — Encounter: Payer: Self-pay | Admitting: Family Medicine

## 2022-11-28 VITALS — BP 140/80 | HR 75 | Temp 97.5°F | Ht 64.0 in | Wt 156.8 lb

## 2022-11-28 DIAGNOSIS — I10 Essential (primary) hypertension: Secondary | ICD-10-CM

## 2022-11-28 DIAGNOSIS — E7800 Pure hypercholesterolemia, unspecified: Secondary | ICD-10-CM

## 2022-11-28 DIAGNOSIS — I7090 Unspecified atherosclerosis: Secondary | ICD-10-CM

## 2022-11-28 LAB — CBC WITH DIFFERENTIAL/PLATELET
Absolute Monocytes: 651 cells/uL (ref 200–950)
Basophils Absolute: 62 cells/uL (ref 0–200)
Basophils Relative: 1 %
Eosinophils Absolute: 260 cells/uL (ref 15–500)
Eosinophils Relative: 4.2 %
HCT: 44.4 % (ref 35.0–45.0)
Hemoglobin: 14.7 g/dL (ref 11.7–15.5)
Lymphs Abs: 1736 cells/uL (ref 850–3900)
MCH: 29.5 pg (ref 27.0–33.0)
MCHC: 33.1 g/dL (ref 32.0–36.0)
MCV: 89.2 fL (ref 80.0–100.0)
MPV: 10.3 fL (ref 7.5–12.5)
Monocytes Relative: 10.5 %
Neutro Abs: 3491 cells/uL (ref 1500–7800)
Neutrophils Relative %: 56.3 %
Platelets: 259 10*3/uL (ref 140–400)
RBC: 4.98 10*6/uL (ref 3.80–5.10)
RDW: 12.5 % (ref 11.0–15.0)
Total Lymphocyte: 28 %
WBC: 6.2 10*3/uL (ref 3.8–10.8)

## 2022-11-28 NOTE — Progress Notes (Signed)
Subjective:    Patient ID: Sarah Williams, female    DOB: [birth year], 77 y.o.   MRN: [id number]  Patient has a history of mild (1-49%) stenosis proximal left internal carotid artery.  She was also found to have plaque on a coronary artery calcium score.  She was found to have a score of 90 which is over the 60th percentile in her LAD.  Patient's sister passed away recently.  Patient suspects that she may have had a pulmonary embolism.  The other possibility would be a heart attack.  She was reporting trouble breathing when it happened.  The patient denies any chest pain.  She denies any shortness of breath.  Her blood pressure at home is well-controlled.  The vast majority of her blood pressures are in the 120 range systolic.  She is exercising on a daily basis and denies any dyspnea on exertion orthopnea.  Carotid Dopplers were checked earlier this year  Past Medical History:  Diagnosis Date   Hyperlipidemia    11/2010   Hypertension    Osteoporosis    PONV (postoperative nausea and vomiting)    Past Surgical History:  Procedure Laterality Date   BREAST CYST ASPIRATION Bilateral    BREAST SURGERY     Benign--Bilateral Lumpectomies--Benign   COLONOSCOPY N/A 03/16/2014   Procedure: COLONOSCOPY;  Surgeon: Corbin Ade, MD;  Location: [facility];  Service: Endoscopy;  Laterality: N/A;  2:15 PM   COSMETIC SURGERY  06/16/2006   blepharoplasty   Current Outpatient Medications on File Prior to Visit  Medication Sig Dispense Refill   benazepril (LOTENSIN) 20 MG tablet Take 1 tablet by mouth once daily 90 tablet 0   Cholecalciferol (VITAMIN D) 2000 UNITS tablet Take 2,000 Units by mouth every evening.     Multiple Vitamin (MULTIVITAMIN) tablet Take 1 tablet by mouth every evening. Centrum Silver     rosuvastatin (CRESTOR) 10 MG tablet Take 1 tablet by mouth once daily 90 tablet 1   No current facility-administered medications on file prior to visit.   No Known Allergies Social  History   Socioeconomic History   Marital status: Married    Spouse name: Iantha Fallen   Number of children: 2   Years of education: Not on file   Highest education level: Not on file  Occupational History   Not on file  Tobacco Use   Smoking status: Never   Smokeless tobacco: Never  Substance and Sexual Activity   Alcohol use: Yes    Alcohol/week: 5.0 standard drinks of alcohol    Types: 5 Glasses of wine per week   Drug use: No   Sexual activity: Yes    Birth control/protection: Post-menopausal  Other Topics Concern   Not on file  Social History Narrative   Entered 02/2014:   Married.   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   Works about 16 hours a week now.   Says she "has to stay busy"--in summer works in yard, garden.    Always working on a project in the house etc.    5 grandaughters   Social Determinants of Health   Financial Resource Strain: Low Risk  (04/19/2022)   Overall Financial Resource Strain (CARDIA)    Difficulty of Paying Living Expenses: Not hard at all  Food Insecurity: No Food Insecurity (04/19/2022)   Hunger Vital Sign    Worried About Running Out  of Food in the Last Year: Never true    Ran Out of Food in the Last Year: Never true  Transportation Needs: No Transportation Needs (04/19/2022)   PRAPARE - Administrator, Civil Service (Medical): No    Lack of Transportation (Non-Medical): No  Physical Activity: Sufficiently Active (04/19/2022)   Exercise Vital Sign    Days of Exercise per Week: 3 days    Minutes of Exercise per Session: 60 min  Stress: No Stress Concern Present (04/19/2022)   [employer] of Occupational Health - Occupational Stress Questionnaire    Feeling of Stress : Not at all  Social Connections: Socially [place] (04/19/2022)   Social Connection and Isolation Panel [NHANES]    Frequency of Communication with Friends and  Family: More than three times a week    Frequency of Social Gatherings with Friends and Family: More than three times a week    Attends Religious Services: More than 4 times per year    Active Member of [employer] or Organizations: Yes    Attends Banker Meetings: More than 4 times per year    Marital Status: Married  Catering manager Violence: Not At Risk (04/19/2022)   Humiliation, Afraid, Rape, and Kick questionnaire    Fear of Current or Ex-Partner: No    Emotionally Abused: No    Physically Abused: No    Sexually Abused: No      Review of Systems     Objective:   Physical Exam Vitals reviewed.  Constitutional:      General: She is not in acute distress.    Appearance: Normal appearance. She is normal weight. She is not ill-appearing.  HENT:     Head: Normocephalic and atraumatic.     Right Ear: Tympanic membrane, ear canal and external ear normal. There is no impacted cerumen.     Left Ear: Tympanic membrane, ear canal and external ear normal. There is no impacted cerumen.     Nose: Nose normal. No congestion or rhinorrhea.     Mouth/Throat:     Mouth: Mucous membranes are moist.     Pharynx: No oropharyngeal exudate or posterior oropharyngeal erythema.  Eyes:     Extraocular Movements: Extraocular movements intact.     Conjunctiva/sclera: Conjunctivae normal.     Pupils: Pupils are equal, round, and reactive to light.  Neck:     Vascular: Carotid bruit present.  Cardiovascular:     Rate and Rhythm: Normal rate and regular rhythm.     Pulses: Normal pulses.     Heart sounds: Normal heart sounds. No murmur heard.    No friction rub. No gallop.  Pulmonary:     Effort: Pulmonary effort is normal. No respiratory distress.     Breath sounds: Normal breath sounds. No stridor. No wheezing, rhonchi or rales.  Chest:     Chest wall: No tenderness.  Abdominal:     General: Bowel sounds are normal. There is no distension.     Palpations: Abdomen is soft. There is no  mass.     Tenderness: There is no abdominal tenderness. There is no rebound.  Musculoskeletal:     Cervical back: Neck supple. No rigidity.     Right lower leg: No edema.     Left lower leg: No edema.  Lymphadenopathy:     Cervical: No cervical adenopathy.  Skin:    General: Skin is warm.     Coloration: Skin is not jaundiced or pale.  Findings: No bruising, erythema, lesion or rash.  Neurological:     General: No focal deficit present.     Mental Status: She is alert and oriented to person, place, and time. Mental status is at baseline.     Cranial Nerves: No cranial nerve deficit.     Sensory: No sensory deficit.     Motor: No weakness.     Coordination: Coordination normal.     Gait: Gait normal.     Deep Tendon Reflexes: Reflexes normal.  Psychiatric:        Mood and Affect: Mood normal.        Behavior: Behavior normal.        Thought Content: Thought content normal.        Judgment: Judgment normal.           Assessment & Plan:  Essential hypertension - Plan: CBC with Differential/Platelet, COMPLETE METABOLIC PANEL WITH GFR, Lipid panel  Atherosclerosis of arteries - Plan: CBC with Differential/Platelet, COMPLETE METABOLIC PANEL WITH GFR, Lipid panel  Pure hypercholesterolemia - Plan: CBC with Differential/Platelet, COMPLETE METABOLIC PANEL WITH GFR, Lipid panel I do detect a faint left-sided carotid bruit today on exam but this is stable.  Her blood pressure at home is well-controlled.  I like to try to keep her LDL cholesterol below 55 given her atherosclerosis.  Otherwise she is doing well.  We discussed signs and symptoms of depression.  She feels like she is dealing mainly with stress and sadness over having to renovate her sister's home but it on the market.  She feels that once the situation has resolved the symptoms would likely improve.  If not, she will let me know

## 2023-02-01 ENCOUNTER — Other Ambulatory Visit: Payer: Self-pay | Admitting: Family Medicine

## 2023-02-01 DIAGNOSIS — I10 Essential (primary) hypertension: Secondary | ICD-10-CM

## 2023-02-01 NOTE — Telephone Encounter (Signed)
Requested Prescriptions  Pending Prescriptions Disp Refills   benazepril (LOTENSIN) 20 MG tablet [Pharmacy Med Name: Benazepril HCl 20 MG Oral Tablet] 90 tablet 1    Sig: Take 1 tablet by mouth once daily     Cardiovascular:  ACE Inhibitors Failed - 02/01/2023  6:50 AM      Failed - Last BP in normal range    BP Readings from Last 1 Encounters:  11/28/22 (!) 140/80         Failed - Valid encounter within last 6 months    Recent Outpatient Visits           1 year ago Pure hypercholesterolemia   [facility] Pickard, Priscille Heidelberg, MD   2 years ago Pure hypercholesterolemia   [facility] Pickard, Priscille Heidelberg, MD   2 years ago Pure hypercholesterolemia   [facility] Pickard, Priscille Heidelberg, MD   3 years ago Pure hypercholesterolemia   [facility] Pickard, Priscille Heidelberg, MD   3 years ago Pure hypercholesterolemia   [facility] Pickard, Priscille Heidelberg, MD       Future Appointments             In 3 months Pickard, Priscille Heidelberg, MD [facility]            Passed - Cr in normal range and within 180 days    Creat  Date Value Ref Range Status  11/28/2022 0.90 0.60 - 1.00 mg/dL Final         Passed - K in normal range and within 180 days    Potassium  Date Value Ref Range Status  11/28/2022 4.6 3.5 - 5.3 mmol/L Final         Passed - Patient is not pregnant

## 2023-05-01 ENCOUNTER — Other Ambulatory Visit: Payer: Self-pay | Admitting: Family Medicine

## 2023-05-02 NOTE — Telephone Encounter (Signed)
 Requested Prescriptions  Pending Prescriptions Disp Refills   rosuvastatin  (CRESTOR ) 10 MG tablet [Pharmacy Med Name: ROSUVASTATIN  10MG  TAB] 90 tablet 0    Sig: Take 1 tablet by mouth once daily     Cardiovascular:  Antilipid - Statins 2 Failed - 05/02/2023  9:03 AM      Failed - Valid encounter within last 12 months    Recent Outpatient Visits           1 year ago Pure hypercholesterolemia   [facility] Pickard, Cisco Crest, MD   2 years ago Pure hypercholesterolemia   [facility] Pickard, Cisco Crest, MD   2 years ago Pure hypercholesterolemia   [facility] Pickard, Cisco Crest, MD   3 years ago Pure hypercholesterolemia   [facility] Pickard, Cisco Crest, MD   4 years ago Pure hypercholesterolemia   [facility] Pickard, Cisco Crest, MD       Future Appointments             In 3 weeks Cheril Cork, Cisco Crest, MD [facility]            Failed - Lipid Panel in normal range within the last 12 months    Cholesterol  Date Value Ref Range Status  11/28/2022 172 <200 mg/dL Final   LDL Cholesterol (Calc)  Date Value Ref Range Status  11/28/2022 73 mg/dL (calc) Final    Comment:    Reference range: <100 . Desirable range <100 mg/dL for primary prevention;   <70 mg/dL for patients with CHD or diabetic patients  with > or = 2 CHD risk factors. Aaron Aas LDL-C is now calculated using the Martin-Hopkins  calculation, which is a validated novel method providing  better accuracy than the Friedewald equation in the  estimation of LDL-C.  Melinda Sprawls et al. Erroll Heard. 4098;119(14): 2061-2068  (http://education.[facility].com/faq/FAQ164)    HDL  Date Value Ref Range Status  11/28/2022 81 > OR = 50 mg/dL Final   Triglycerides  Date Value Ref Range Status  11/28/2022 92 <150 mg/dL Final         Passed - Cr in normal range and within 360 days    Creat  Date Value Ref Range Status   11/28/2022 0.90 0.60 - 1.00 mg/dL Final         Passed - Patient is not pregnant

## 2023-05-29 ENCOUNTER — Ambulatory Visit (INDEPENDENT_AMBULATORY_CARE_PROVIDER_SITE_OTHER): Payer: PPO | Admitting: Family Medicine

## 2023-05-29 ENCOUNTER — Encounter: Payer: Self-pay | Admitting: Family Medicine

## 2023-05-29 VITALS — BP 132/82 | HR 65 | Temp 97.6°F | Ht 64.0 in | Wt 161.0 lb

## 2023-05-29 DIAGNOSIS — I10 Essential (primary) hypertension: Secondary | ICD-10-CM | POA: Diagnosis not present

## 2023-05-29 DIAGNOSIS — Z1211 Encounter for screening for malignant neoplasm of colon: Secondary | ICD-10-CM

## 2023-05-29 DIAGNOSIS — I7090 Unspecified atherosclerosis: Secondary | ICD-10-CM | POA: Diagnosis not present

## 2023-05-29 DIAGNOSIS — E7800 Pure hypercholesterolemia, unspecified: Secondary | ICD-10-CM

## 2023-05-29 NOTE — Progress Notes (Signed)
Subjective:    Patient ID: Sarah Williams, female    DOB: 27-Jun-1945, 78 y.o.   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  She was also found to have plaque on a coronary artery calcium score.  She was found to have a score of 90 which is over the 60th percentile in her LAD.  She is currently tolerating her statin without difficulty.  She exercises daily at the gym.  She denies any myalgias or right upper quadrant pain.  She denies any chest pain or shortness of exertion.  Pressure and was silly less than 140/90.  She is due for mammogram this year.  She is also due for her colonoscopy. Past Medical History:  Diagnosis Date   Hyperlipidemia    11/2010   Hypertension    Osteoporosis    PONV (postoperative nausea and vomiting)    Past Surgical History:  Procedure Laterality Date   BREAST CYST ASPIRATION Bilateral    BREAST SURGERY     Benign--Bilateral Lumpectomies--Benign   COLONOSCOPY N/A 03/16/2014   Procedure: COLONOSCOPY;  Surgeon: Corbin Ade, MD;  [facility];  Service: Endoscopy;  Laterality: N/A;  2:15 PM   COSMETIC SURGERY  06/16/2006   blepharoplasty   Current Outpatient Medications on File Prior to Visit  Medication Sig Dispense Refill   benazepril (LOTENSIN) 20 MG tablet Take 1 tablet by mouth once daily 90 tablet 1   Cholecalciferol (VITAMIN D) 2000 UNITS tablet Take 2,000 Units by mouth every evening.     Multiple Vitamin (MULTIVITAMIN) tablet Take 1 tablet by mouth every evening. Centrum Silver     rosuvastatin (CRESTOR) 10 MG tablet Take 1 tablet by mouth once daily 90 tablet 0   No current facility-administered medications on file prior to visit.   No Known Allergies Social History   Socioeconomic History   Marital status: Married    Spouse name: Iantha Fallen   Number of children: 2   Years of education: Not on file   Highest education level: Not on file  Occupational History   Not on  file  Tobacco Use   Smoking status: Never   Smokeless tobacco: Never  Substance and Sexual Activity   Alcohol use: Yes    Alcohol/week: 5.0 standard drinks of alcohol    Types: 5 Glasses of wine per week   Drug use: No   Sexual activity: Yes    Birth control/protection: Post-menopausal  Other Topics Concern   Not on file  Social History Narrative   Entered 02/2014:   Married.   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   Works about 16 hours a week now.   Says she "has to stay busy"--in summer works in yard, garden.    Always working on a project in the house etc.    5 grandaughters   Social Drivers of Health   Financial Resource Strain: Low Risk  (04/19/2022)   Overall Financial Resource Strain (CARDIA)    Difficulty of Paying Living Expenses: Not hard at all  Food Insecurity: No Food Insecurity (04/19/2022)   Hunger Vital Sign    Worried About Running Out of Food in the Last Year: Never true    Ran Out of Food in the Last Year: Never true  Transportation Needs: No Transportation Needs (04/19/2022)   PRAPARE - Transportation  Lack of Transportation (Medical): No    Lack of Transportation (Non-Medical): No  Physical Activity: Sufficiently Active (04/19/2022)   Exercise Vital Sign    Days of Exercise per Week: 3 days    Minutes of Exercise per Session: 60 min  Stress: No Stress Concern Present (04/19/2022)   [employer] of Occupational Health - Occupational Stress Questionnaire    Feeling of Stress : Not at all  Social Connections: Socially Integrated (04/19/2022)   Social Connection and Isolation Panel [NHANES]    Frequency of Communication with Friends and Family: More than three times a week    Frequency of Social Gatherings with Friends and Family: More than three times a week    Attends Religious Services: More than 4 times per year    Active Member of [employer] or  Organizations: Yes    Attends Banker Meetings: More than 4 times per year    Marital Status: Married  Catering manager Violence: Not At Risk (04/19/2022)   Humiliation, Afraid, Rape, and Kick questionnaire    Fear of Current or Ex-Partner: No    Emotionally Abused: No    Physically Abused: No    Sexually Abused: No      Review of Systems     Objective:   Physical Exam Vitals reviewed.  Constitutional:      General: She is not in acute distress.    Appearance: Normal appearance. She is normal weight. She is not ill-appearing.  HENT:     Head: Normocephalic and atraumatic.     Right Ear: Tympanic membrane, ear canal and external ear normal. There is no impacted cerumen.     Left Ear: Tympanic membrane, ear canal and external ear normal. There is no impacted cerumen.     Nose: Nose normal. No congestion or rhinorrhea.     Mouth/Throat:     Mouth: Mucous membranes are moist.     Pharynx: No oropharyngeal exudate or posterior oropharyngeal erythema.  Eyes:     Extraocular Movements: Extraocular movements intact.     Conjunctiva/sclera: Conjunctivae normal.     Pupils: Pupils are equal, round, and reactive to light.  Neck:     Vascular: Carotid bruit present.  Cardiovascular:     Rate and Rhythm: Normal rate and regular rhythm.     Pulses: Normal pulses.     Heart sounds: Normal heart sounds. No murmur heard.    No friction rub. No gallop.  Pulmonary:     Effort: Pulmonary effort is normal. No respiratory distress.     Breath sounds: Normal breath sounds. No stridor. No wheezing, rhonchi or rales.  Chest:     Chest wall: No tenderness.  Abdominal:     General: Bowel sounds are normal. There is no distension.     Palpations: Abdomen is soft. There is no mass.     Tenderness: There is no abdominal tenderness. There is no rebound.  Musculoskeletal:     Cervical back: Neck supple. No rigidity.     Right lower leg: No edema.     Left lower leg: No edema.   Lymphadenopathy:     Cervical: No cervical adenopathy.  Skin:    General: Skin is warm.     Coloration: Skin is not jaundiced or pale.     Findings: No bruising, erythema, lesion or rash.  Neurological:     General: No focal deficit present.     Mental Status: She is alert and oriented to person, place, and time. Mental  status is at baseline.     Cranial Nerves: No cranial nerve deficit.     Sensory: No sensory deficit.     Motor: No weakness.     Coordination: Coordination normal.     Gait: Gait normal.     Deep Tendon Reflexes: Reflexes normal.  Psychiatric:        Mood and Affect: Mood normal.        Behavior: Behavior normal.        Thought Content: Thought content normal.        Judgment: Judgment normal.           Assessment & Plan:  Atherosclerosis of arteries - Plan: CBC with Differential/Platelet, COMPLETE METABOLIC PANEL WITH GFR, Lipid panel  Essential hypertension - Plan: CBC with Differential/Platelet, COMPLETE METABOLIC PANEL WITH GFR, Lipid panel  Pure hypercholesterolemia - Plan: CBC with Differential/Platelet, COMPLETE METABOLIC PANEL WITH GFR, Lipid panel  Colon cancer screening - Plan: Ambulatory referral to Gastroenterology  I do detect a faint left-sided carotid bruit today on exam but this is stable.  Her blood pressure at home is well-controlled.  I like to try to keep her LDL cholesterol below 55 given her atherosclerosis.  Check CBC CMP and a fasting lipid panel.  Patient will schedule her own mammogram per her request.  I will schedule her for a colonoscopy.

## 2023-05-30 ENCOUNTER — Encounter (INDEPENDENT_AMBULATORY_CARE_PROVIDER_SITE_OTHER): Payer: Self-pay | Admitting: *Deleted

## 2023-05-30 LAB — COMPLETE METABOLIC PANEL WITH GFR
AG Ratio: 1.9 (calc) (ref 1.0–2.5)
ALT: 15 U/L (ref 6–29)
AST: 20 U/L (ref 10–35)
Albumin: 4.5 g/dL (ref 3.6–5.1)
Alkaline phosphatase (APISO): 79 U/L (ref 37–153)
BUN: 21 mg/dL (ref 7–25)
CO2: 29 mmol/L (ref 20–32)
Calcium: 9.9 mg/dL (ref 8.6–10.4)
Chloride: 105 mmol/L (ref 98–110)
Creat: 0.94 mg/dL (ref 0.60–1.00)
Globulin: 2.4 g/dL (ref 1.9–3.7)
Glucose, Bld: 98 mg/dL (ref 65–99)
Potassium: 4.6 mmol/L (ref 3.5–5.3)
Sodium: 142 mmol/L (ref 135–146)
Total Bilirubin: 0.4 mg/dL (ref 0.2–1.2)
Total Protein: 6.9 g/dL (ref 6.1–8.1)
eGFR: 63 mL/min/{1.73_m2} (ref >=60)

## 2023-05-30 LAB — CBC WITH DIFFERENTIAL/PLATELET
Absolute Lymphocytes: 2060 {cells}/uL (ref 850–3900)
Absolute Monocytes: 605 {cells}/uL (ref 200–950)
Basophils Absolute: 48 {cells}/uL (ref 0–200)
Basophils Relative: 0.7 %
Eosinophils Absolute: 211 {cells}/uL (ref 15–500)
Eosinophils Relative: 3.1 %
HCT: 44.5 % (ref 35.0–45.0)
Hemoglobin: 14.6 g/dL (ref 11.7–15.5)
MCH: 29.4 pg (ref 27.0–33.0)
MCHC: 32.8 g/dL (ref 32.0–36.0)
MCV: 89.7 fL (ref 80.0–100.0)
MPV: 10.8 fL (ref 7.5–12.5)
Monocytes Relative: 8.9 %
Neutro Abs: 3876 {cells}/uL (ref 1500–7800)
Neutrophils Relative %: 57 %
Platelets: 225 10*3/uL (ref 140–400)
RBC: 4.96 10*6/uL (ref 3.80–5.10)
RDW: 12.4 % (ref 11.0–15.0)
Total Lymphocyte: 30.3 %
WBC: 6.8 10*3/uL (ref 3.8–10.8)

## 2023-05-30 LAB — LIPID PANEL
Cholesterol: 163 mg/dL (ref <200)
HDL: 80 mg/dL (ref >=50)
LDL Cholesterol (Calc): 62 mg/dL
Non-HDL Cholesterol (Calc): 83 mg/dL (ref <130)
Total CHOL/HDL Ratio: 2 (calc) (ref <5.0)
Triglycerides: 126 mg/dL (ref <150)

## 2023-06-06 ENCOUNTER — Ambulatory Visit: Payer: PPO | Admitting: Gastroenterology

## 2023-06-06 ENCOUNTER — Encounter: Payer: Self-pay | Admitting: Gastroenterology

## 2023-06-06 VITALS — BP 171/70 | HR 83 | Temp 97.7°F | Ht 64.0 in | Wt 164.4 lb

## 2023-06-06 DIAGNOSIS — I10 Essential (primary) hypertension: Secondary | ICD-10-CM

## 2023-06-06 DIAGNOSIS — Z1211 Encounter for screening for malignant neoplasm of colon: Secondary | ICD-10-CM | POA: Diagnosis not present

## 2023-06-06 NOTE — Progress Notes (Signed)
Referring Provider: Donita Brooks, MD Primary Care Physician:  Donita Brooks, MD Primary Gastroenterologist:  Dr. Jena Gauss  Chief Complaint  Patient presents with   Colonoscopy    HPI:   Sarah Williams is a 78 y.o. female presenting today at the request of Donita Brooks, MD to discuss scheduling colonoscopy.   Last colonoscopy 03/16/2014: Colonic diverticulosis. Recommended repeat in 10 years.   Reports he is doing well overall.  She has no GI concerns.  Denies abdominal pain, BRBPR, melena, change in bowel habits, constipation, diarrhea, nausea, vomiting, GERD, dysphagia.  Blood pressure is elevated today.  Reports this is unusual in general, but she has noticed that her blood pressure does tend to jump up when she goes to the doctor.  States at home, blood pressure typically runs in the 120s to 130s.  She went to the gym this morning for an hour and has felt fine.  Denies chest pain, shortness of breath, headache, blurry vision.  She takes her blood pressure medication at night and did take it last night.  She did feel a little more stressed today coming to our office and with the winter weather approaching.    Past Medical History:  Diagnosis Date   Hyperlipidemia    11/2010   Hypertension    Osteoporosis    PONV (postoperative nausea and vomiting)     Past Surgical History:  Procedure Laterality Date   BREAST CYST ASPIRATION Bilateral    BREAST SURGERY     Benign--Bilateral Lumpectomies--Benign   COLONOSCOPY N/A 03/16/2014   Procedure: COLONOSCOPY;  Surgeon: Corbin Ade, MD;  [facility];  Service: Endoscopy;  Laterality: N/A;  2:15 PM   COSMETIC SURGERY  06/16/2006   blepharoplasty    Current Outpatient Medications  Medication Sig Dispense Refill   benazepril (LOTENSIN) 20 MG tablet Take 1 tablet by mouth once daily 90 tablet 1   Cholecalciferol (VITAMIN D) 2000 UNITS tablet Take 2,000 Units by mouth every evening.     Multiple Vitamin  (MULTIVITAMIN) tablet Take 1 tablet by mouth every evening. Centrum Silver     rosuvastatin (CRESTOR) 10 MG tablet Take 1 tablet by mouth once daily 90 tablet 0   No current facility-administered medications for this visit.    Allergies as of 06/06/2023   (No Known Allergies)    Family History  Problem Relation Age of Onset   Heart disease Mother 84       CAD   Hypertension Father    Kidney disease Father    Stroke Father    Heart disease Paternal Grandfather 61       CAD, MI   BRCA 1/2 Neg Hx    Breast cancer Neg Hx     Social History   Socioeconomic History   Marital status: Married    Spouse name: Iantha Fallen   Number of children: 2   Years of education: Not on file   Highest education level: Not on file  Occupational History   Not on file  Tobacco Use   Smoking status: Never   Smokeless tobacco: Never  Substance and Sexual Activity   Alcohol use: Yes    Alcohol/week: 5.0 standard drinks of alcohol    Types: 5 Glasses of wine per week   Drug use: No   Sexual activity: Yes    Birth control/protection: Post-menopausal  Other Topics Concern   Not on file  Social History Narrative   Entered 02/2014:   Married.  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   Works about 16 hours a week now.   Says she "has to stay busy"--in summer works in yard, garden.    Always working on a project in the house etc.    5 grandaughters   Social Drivers of Health   Financial Resource Strain: Low Risk  (04/19/2022)   Overall Financial Resource Strain (CARDIA)    Difficulty of Paying Living Expenses: Not hard at all  Food Insecurity: No Food Insecurity (04/19/2022)   Hunger Vital Sign    Worried About Running Out of Food in the Last Year: Never true    Ran Out of Food in the Last Year: Never true  Transportation Needs: No Transportation Needs (04/19/2022)   PRAPARE - Therapist, art (Medical): No    Lack of Transportation (Non-Medical): No  Physical Activity: Sufficiently Active (04/19/2022)   Exercise Vital Sign    Days of Exercise per Week: 3 days    Minutes of Exercise per Session: 60 min  Stress: No Stress Concern Present (04/19/2022)   [employer] of Occupational Health - Occupational Stress Questionnaire    Feeling of Stress : Not at all  Social Connections: Socially Integrated (04/19/2022)   Social Connection and Isolation Panel [NHANES]    Frequency of Communication with Friends and Family: More than three times a week    Frequency of Social Gatherings with Friends and Family: More than three times a week    Attends Religious Services: More than 4 times per year    Active Member of [employer] or Organizations: Yes    Attends Engineer, structural: More than 4 times per year    Marital Status: Married  Catering manager Violence: Not At Risk (04/19/2022)   Humiliation, Afraid, Rape, and Kick questionnaire    Fear of Current or Ex-Partner: No    Emotionally Abused: No    Physically Abused: No    Sexually Abused: No    Review of Systems: Gen: Denies any fever, chills, cold or flulike symptoms, presyncope, syncope. CV: Denies chest pain, heart palpitations. Resp: Denies shortness of breath, cough. GI: See HPI GU : Denies urinary burning, urinary frequency, urinary hesitancy MS: Denies joint pain. Derm: Denies rash. Psych: Denies depression, anxiety. Heme: See HPI  Physical Exam: BP (!) 171/70 (BP Location: Right Arm, Patient Position: Sitting, Cuff Size: Normal)   Pulse 83   Temp 97.7 F (36.5 C) (Oral)   Ht 5\' 4"  (1.626 m)   Wt 164 lb 6.4 oz (74.6 kg)   SpO2 98%   BMI 28.22 kg/m  General:   Alert and oriented. Pleasant and cooperative. Well-nourished and well-developed.  Head:  Normocephalic and atraumatic. Eyes:  Without icterus, sclera clear and conjunctiva pink.  Ears:  Normal auditory acuity. Lungs:  Clear to  auscultation bilaterally. No wheezes, rales, or rhonchi. No distress.  Heart:  S1, S2 present without murmurs appreciated.  Abdomen:  +BS, soft, non-tender and non-distended. No HSM noted. No guarding or rebound. No masses appreciated.  Rectal:  Deferred  Msk:  Symmetrical without gross deformities. Normal posture. Extremities:  Without edema. Neurologic:  Alert and  oriented x4;  grossly normal neurologically. Skin:  Intact without significant lesions or rashes. Psych:  Normal mood and affect.    Assessment:  78 year old female with history of HTN, HLD, osteoporosis, presenting today to discuss scheduling  screening colonoscopy.  Last colonoscopy in November 2015 with colonic diverticulosis, recommended 10-year screening.  Clinically doing well with no significant GI symptoms.  No alarm symptoms.  Will hold off on scheduling colonoscopy until she is due in early December 2025.   Notably, blood pressure is elevated today with systolic consistently in the 170s.  Patient reports compliance with BP medication and is asymptomatic.  Notes blood pressure does tend to run high when seen in a doctor's office, possible white coat syndrome. Recommended rechecking her blood pressure when she returns home and if blood pressure remains significantly elevated, contact PCP today and if unable to reach them, proceed to the ER for blood pressure to be lowered.  Otherwise, advised to follow-up with PCP for ongoing monitoring/management.   Plan:  Follow-up in November 2025 to discuss scheduling colonoscopy. Patient will monitor BP at home. If it remains elevated systolic greater than 160, contact PCP for instruction or proceed to the ER if unable to reach PCP. Otherwise, follow-up with PCP for ongoing management and monitoring.    Ermalinda Memos, PA-C [facility] 06/06/2023

## 2023-06-06 NOTE — Patient Instructions (Addendum)
Next colonoscopy is due in December 2025.  I will plan to see back in November to get this scheduled.  Your blood pressure is elevated today.  I recommend that you recheck your blood pressure when you get home.  If your blood pressure does not come back down to your normal with top number being in the 120s to 130s, please contact your primary care provider.  If your blood pressure remains significantly elevated, top number greater than 160, would recommend that you contact your PCP today.  If you are not able to reach your PCP, I recommend that you go to the ER to have your blood pressure lowered.  Ermalinda Memos, PA-C [facility]

## 2023-07-28 ENCOUNTER — Other Ambulatory Visit: Payer: Self-pay | Admitting: Family Medicine

## 2023-07-28 DIAGNOSIS — I10 Essential (primary) hypertension: Secondary | ICD-10-CM

## 2023-07-30 NOTE — Telephone Encounter (Signed)
 Requested Prescriptions  Pending Prescriptions Disp Refills   benazepril (LOTENSIN) 20 MG tablet [Pharmacy Med Name: Benazepril HCl 20 MG Oral Tablet] 90 tablet 0    Sig: Take 1 tablet by mouth once daily     Cardiovascular:  ACE Inhibitors Failed - 07/30/2023 11:49 AM      Failed - Last BP in normal range    BP Readings from Last 1 Encounters:  06/06/23 (!) 171/70         Passed - Cr in normal range and within 180 days    Creat  Date Value Ref Range Status  05/29/2023 0.94 0.60 - 1.00 mg/dL Final         Passed - K in normal range and within 180 days    Potassium  Date Value Ref Range Status  05/29/2023 4.6 3.5 - 5.3 mmol/L Final         Passed - Patient is not pregnant      Passed - Valid encounter within last 6 months    Recent Outpatient Visits           2 months ago Atherosclerosis of arteries   [place] [facility] Donita Brooks, MD   8 months ago Essential hypertension   [place] [facility] Pickard, Priscille Heidelberg, MD   1 year ago Essential hypertension   [place] [facility] Pickard, Priscille Heidelberg, MD   1 year ago Pure hypercholesterolemia   [place] [facility] Pickard, Priscille Heidelberg, MD               rosuvastatin (CRESTOR) 10 MG tablet [Pharmacy Med Name: ROSUVASTATIN 10MG  TAB] 90 tablet 0    Sig: Take 1 tablet by mouth once daily     Cardiovascular:  Antilipid - Statins 2 Failed - 07/30/2023 11:49 AM      Failed - Lipid Panel in normal range within the last 12 months    Cholesterol  Date Value Ref Range Status  05/29/2023 163 <200 mg/dL Final   LDL Cholesterol (Calc)  Date Value Ref Range Status  05/29/2023 62 mg/dL (calc) Final    Comment:    Reference range: <100 . Desirable range <100 mg/dL for primary prevention;   <70 mg/dL for patients with CHD or diabetic patients  with > or = 2 CHD risk factors. Marland Kitchen LDL-C is now calculated using the Martin-Hopkins  calculation, which  is a validated novel method providing  better accuracy than the Friedewald equation in the  estimation of LDL-C.  Horald Pollen et al. Lenox Ahr. 1610;960(45): 2061-2068  (http://education.[facility].com/faq/FAQ164)    HDL  Date Value Ref Range Status  05/29/2023 80 > OR = 50 mg/dL Final   Triglycerides  Date Value Ref Range Status  05/29/2023 126 <150 mg/dL Final         Passed - Cr in normal range and within 360 days    Creat  Date Value Ref Range Status  05/29/2023 0.94 0.60 - 1.00 mg/dL Final         Passed - Patient is not pregnant      Passed - Valid encounter within last 12 months    Recent Outpatient Visits           2 months ago Atherosclerosis of arteries   [place] [facility] Pickard, Priscille Heidelberg, MD   8 months ago Essential hypertension   [place] [facility] Pickard, Priscille Heidelberg, MD  1 year ago Essential hypertension   [place] [facility] Pickard, Cisco Crest, MD   1 year ago Pure hypercholesterolemia   [place] [facility] Pickard, Cisco Crest, MD

## 2023-10-16 DIAGNOSIS — Z1231 Encounter for screening mammogram for malignant neoplasm of breast: Secondary | ICD-10-CM | POA: Diagnosis not present

## 2023-10-23 DIAGNOSIS — H25813 Combined forms of age-related cataract, bilateral: Secondary | ICD-10-CM | POA: Diagnosis not present

## 2023-10-25 ENCOUNTER — Other Ambulatory Visit: Payer: Self-pay | Admitting: Family Medicine

## 2023-10-25 DIAGNOSIS — I10 Essential (primary) hypertension: Secondary | ICD-10-CM

## 2023-11-13 IMAGING — CT CT CARDIAC CORONARY ARTERY CALCIUM SCORE
2 of 3 series · 8 of 20 positions shown, 10 images · non-contrast
Comparison: None.
COMPARISON: None.

Addendum:
EXAM:
OVER-READ INTERPRETATION  CT CHEST

The following report is an over-read performed by radiologist Dr.
Chrisley Buendia [REDACTED] on 08/03/2021. This
over-read does not include interpretation of cardiac or coronary
anatomy or pathology. The coronary calcium score interpretation by
the cardiologist is attached.
CLINICAL DATA: Cardiovascular Disease Risk stratification
Coronary Calcium Score
TECHNIQUE: A gated, non-contrast computed tomography scan of the heart was
performed using 3mm slice thickness. Axial images were analyzed on a
dedicated workstation. Calcium scoring of the coronary arteries was
performed using the Agatston method.

[Series 2: cascseq 3.0 qr36 70% · axial · 0.65mm/px · z∈[+1401,+1446]mm · 2 of 45 slices shown]
[im 15/45  vessel]
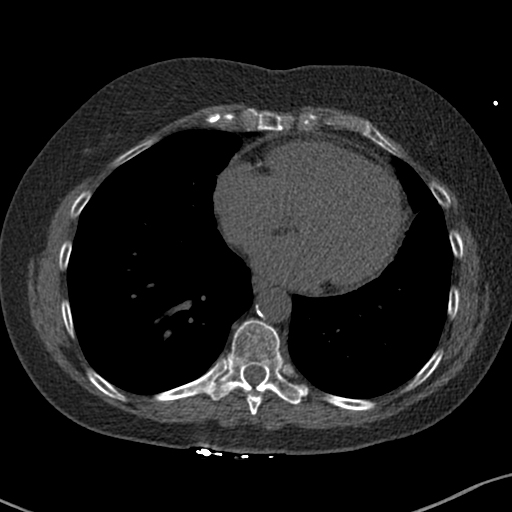
[im 30/45  vessel]
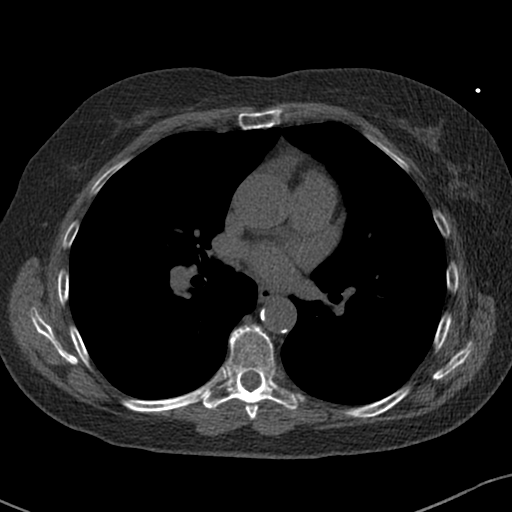

[Series 4: ax lung · axial · 0.80mm/px · z∈[+1376,+1472]mm · 6 of 68 slices shown, 8 images]
[im 10/68  vessel]
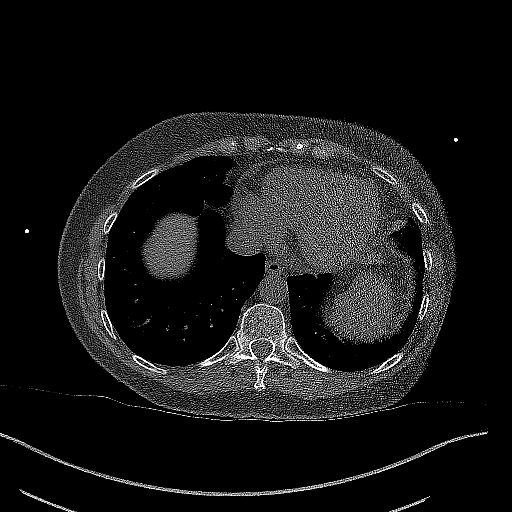
[im 10/68  lung]
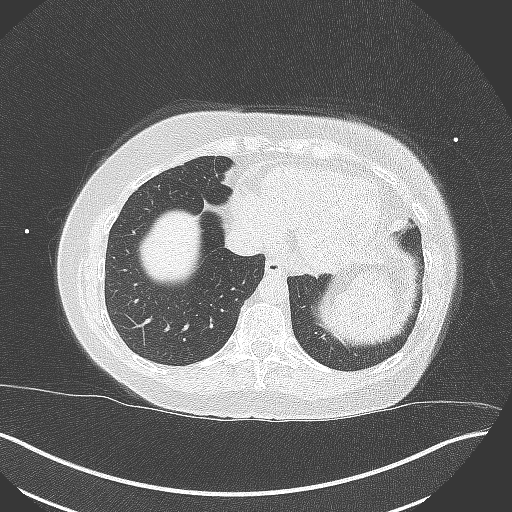
[im 20/68  vessel]
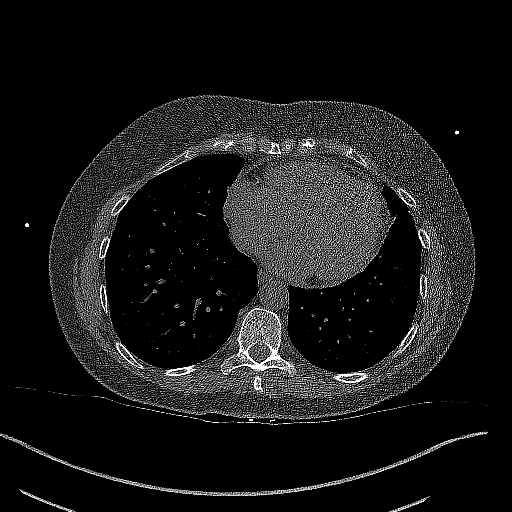
[im 29/68  vessel]
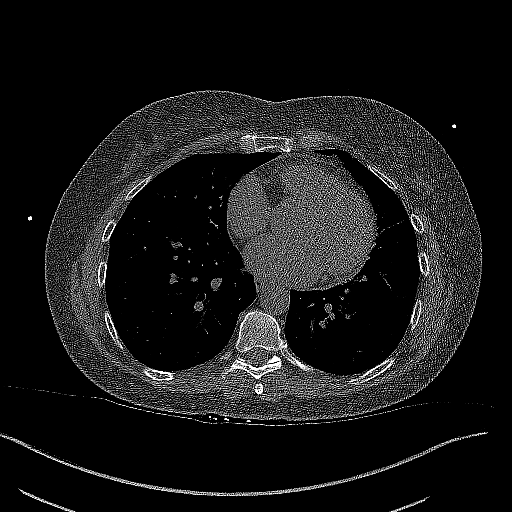
[im 39/68  vessel]
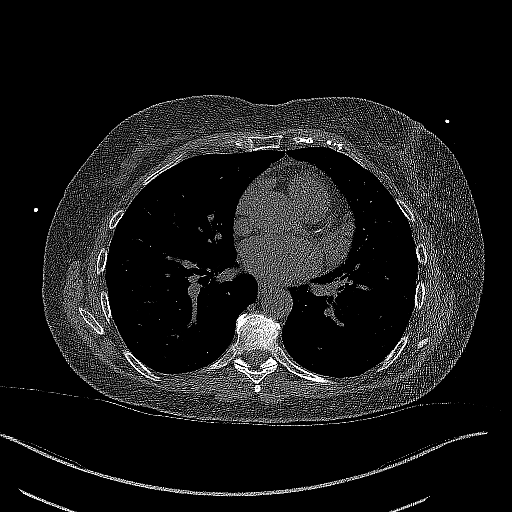
[im 48/68  vessel]
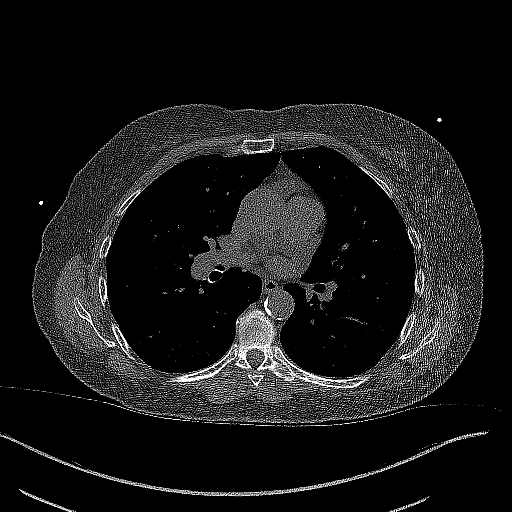
[im 48/68  lung]
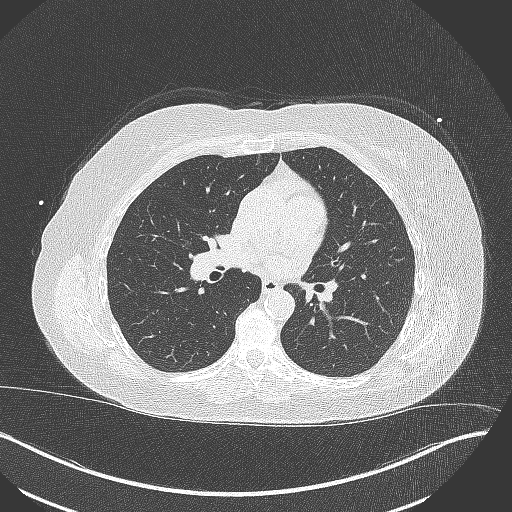
[im 58/68  vessel]
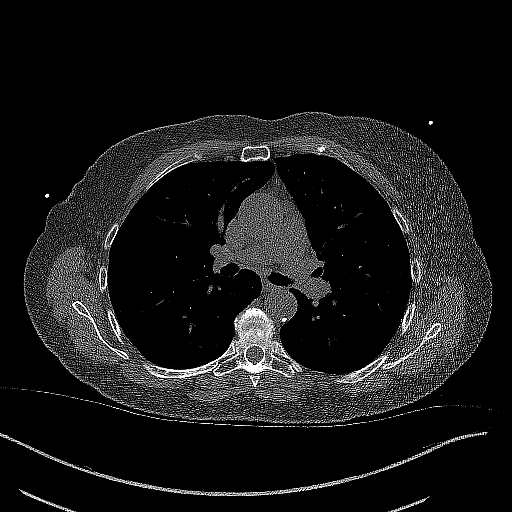

[8 of 20 positions shown; findings below may reference images not displayed]

FINDINGS: Atherosclerotic calcifications in the thoracic aorta. Within the
visualized portions of the thorax there are no suspicious appearing
pulmonary nodules or masses, there is no acute consolidative
airspace disease, no pleural effusions, no pneumothorax and no
lymphadenopathy. Visualized portions of the upper abdomen are
unremarkable. There are no aggressive appearing lytic or blastic
lesions noted in the visualized portions of the skeleton.
IMPRESSION: 1.  Aortic Atherosclerosis ([id number]-[id number].T).
FINDINGS: Coronary arteries: Normal origins.

Coronary Calcium Score:

Left main:

Left anterior descending artery: 88

Left circumflex artery:

Right coronary artery: 2

Total: 90

Percentile: 63

Pericardium: Normal.

Ascending Aorta: Mild dilatation measuring 38mm

Non-cardiac: See separate report from [REDACTED].
IMPRESSION: 1. Coronary calcium score of 90. This was 63rd percentile for age-,
race-, and sex-matched controls.

2. Mild dilatation ascending aorta measuring 38mm



If CAC=0, it is reasonable to withhold statin therapy and reassess
in 5 to 10 years, as long as higher risk conditions are absent
(diabetes mellitus, family history of premature CHD in first degree
relatives (males <55 years; females <65 years), cigarette smoking,
or LDL >=190 mg/dL).

If CAC is 1 to 99, it is reasonable to initiate statin therapy for
patients >=55 years of age.

If CAC is >=100 or >=75th percentile, it is reasonable to initiate
statin therapy at any age.

Cardiology referral should be considered for patients with CAC
scores >=400 or >=75th percentile.

*7210 AHA/ACC/AACVPR/AAPA/ABC/TOTTIO/ZAHAVA/ARREGUIN/Cyprian/KLAUS/SIGAUD/KADIA
Guideline on the Management of Blood Cholesterol: A Report of the
American College of Cardiology/American Heart Association Task Force
on Clinical Practice Guidelines. J Am Coll Cardiol.
9456;73(24):8319-8358.

*** End of Addendum ***
EXAM:
OVER-READ INTERPRETATION  CT CHEST

The following report is an over-read performed by radiologist Dr.
Chrisley Buendia [REDACTED] on 08/03/2021. This
over-read does not include interpretation of cardiac or coronary
anatomy or pathology. The coronary calcium score interpretation by
the cardiologist is attached.
FINDINGS: Atherosclerotic calcifications in the thoracic aorta. Within the
visualized portions of the thorax there are no suspicious appearing
pulmonary nodules or masses, there is no acute consolidative
airspace disease, no pleural effusions, no pneumothorax and no
lymphadenopathy. Visualized portions of the upper abdomen are
unremarkable. There are no aggressive appearing lytic or blastic
lesions noted in the visualized portions of the skeleton.
IMPRESSION: 1.  Aortic Atherosclerosis (Q9XZB-41T.T).

## 2023-11-29 ENCOUNTER — Ambulatory Visit (INDEPENDENT_AMBULATORY_CARE_PROVIDER_SITE_OTHER): Payer: PPO | Admitting: Family Medicine

## 2023-11-29 ENCOUNTER — Encounter: Payer: Self-pay | Admitting: Family Medicine

## 2023-11-29 VITALS — BP 142/82 | HR 73 | Temp 98.5°F | Ht 64.0 in | Wt 156.0 lb

## 2023-11-29 DIAGNOSIS — I7090 Unspecified atherosclerosis: Secondary | ICD-10-CM

## 2023-11-29 DIAGNOSIS — E7800 Pure hypercholesterolemia, unspecified: Secondary | ICD-10-CM

## 2023-11-29 DIAGNOSIS — I10 Essential (primary) hypertension: Secondary | ICD-10-CM

## 2023-11-29 DIAGNOSIS — Z1211 Encounter for screening for malignant neoplasm of colon: Secondary | ICD-10-CM

## 2023-11-29 MED ORDER — BENAZEPRIL HCL 40 MG PO TABS: 40.0000 mg | ORAL_TABLET | Freq: Every day | ORAL | 3 refills | Status: AC

## 2023-11-29 NOTE — Progress Notes (Signed)
 Subjective:    Patient ID: Sarah Williams, female    DOB: 28-Jun-1945, 78 y.o.   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  She was also found to have plaque on a coronary artery calcium score.  She was found to have a score of 90 which is over the 60th percentile in her LAD.  Patient is due for the shingles vaccine.  Since I last saw her, she had her mammogram.  She has changed her mind and she would like to get Cologuard instead of a colonoscopy.  Otherwise she is doing well.  She denies any chest pain or shortness of breath.  Blood pressure at home is occasionally in the 140-150 range systolic Past Medical History:  Diagnosis Date   Hyperlipidemia    11/2010   Hypertension    Osteoporosis    PONV (postoperative nausea and vomiting)    Past Surgical History:  Procedure Laterality Date   BREAST CYST ASPIRATION Bilateral    BREAST SURGERY     Benign--Bilateral Lumpectomies--Benign   COLONOSCOPY N/A 03/16/2014   Procedure: COLONOSCOPY;  Surgeon: Lamar CHRISTELLA Hollingshead, MD;  Location: [facility];  Service: Endoscopy;  Laterality: N/A;  2:15 PM   COSMETIC SURGERY  06/16/2006   blepharoplasty   Current Outpatient Medications on File Prior to Visit  Medication Sig Dispense Refill   benazepril (LOTENSIN) 20 MG tablet Take 1 tablet by mouth once daily 90 tablet 0   Cholecalciferol (VITAMIN D) 2000 UNITS tablet Take 2,000 Units by mouth every evening.     Multiple Vitamin (MULTIVITAMIN) tablet Take 1 tablet by mouth every evening. Centrum Silver     rosuvastatin (CRESTOR) 10 MG tablet Take 1 tablet by mouth once daily 90 tablet 0   No current facility-administered medications on file prior to visit.   No Known Allergies Social History   Socioeconomic History   Marital status: Married    Spouse name: Vinie   Number of children: 2   Years of education: Not on file   Highest education level: 12th grade  Occupational History    Not on file  Tobacco Use   Smoking status: Never   Smokeless tobacco: Never  Substance and Sexual Activity   Alcohol use: Yes    Alcohol/week: 5.0 standard drinks of alcohol    Types: 5 Glasses of wine per week   Drug use: No   Sexual activity: Yes    Birth control/protection: Post-menopausal  Other Topics Concern   Not on file  Social History Narrative   Entered 02/2014:   Married.   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   Works about 16 hours a week now.   Says she has to stay busy--in summer works in yard, garden.    Always working on a project in the house etc.    5 grandaughters   Social Drivers of Health   Financial Resource Strain: Low Risk  (11/26/2023)   Overall Financial Resource Strain (CARDIA)    Difficulty of Paying Living Expenses: Not hard at all  Food Insecurity: No Food Insecurity (11/26/2023)   Hunger Vital Sign    Worried About Running Out of Food in the Last Year: Never true    Ran Out of Food in the Last Year: Never true  Transportation Needs: No Transportation Needs (11/26/2023)   PRAPARE - Transportation  Lack of Transportation (Medical): No    Lack of Transportation (Non-Medical): No  Physical Activity: Sufficiently Active (11/26/2023)   Exercise Vital Sign    Days of Exercise per Week: 5 days    Minutes of Exercise per Session: 60 min  Stress: No Stress Concern Present (11/26/2023)   [employer] of Occupational Health - Occupational Stress Questionnaire    Feeling of Stress: Not at all  Social Connections: Moderately Isolated (11/26/2023)   Social Connection and Isolation Panel    Frequency of Communication with Friends and Family: More than three times a week    Frequency of Social Gatherings with Friends and Family: Twice a week    Attends Religious Services: Never    Database administrator or Organizations: No    Attends Museum/gallery exhibitions officer: Not on file    Marital Status: Married  Catering manager Violence: Not At Risk (04/19/2022)   Humiliation, Afraid, Rape, and Kick questionnaire    Fear of Current or Ex-Partner: No    Emotionally Abused: No    Physically Abused: No    Sexually Abused: No      Review of Systems     Objective:   Physical Exam Vitals reviewed.  Constitutional:      General: She is not in acute distress.    Appearance: Normal appearance. She is normal weight. She is not ill-appearing.  HENT:     Head: Normocephalic and atraumatic.     Right Ear: Tympanic membrane, ear canal and external ear normal. There is no impacted cerumen.     Left Ear: Tympanic membrane, ear canal and external ear normal. There is no impacted cerumen.     Nose: Nose normal. No congestion or rhinorrhea.     Mouth/Throat:     Mouth: Mucous membranes are moist.     Pharynx: No oropharyngeal exudate or posterior oropharyngeal erythema.  Eyes:     Extraocular Movements: Extraocular movements intact.     Conjunctiva/sclera: Conjunctivae normal.     Pupils: Pupils are equal, round, and reactive to light.  Neck:     Vascular: Carotid bruit present.  Cardiovascular:     Rate and Rhythm: Normal rate and regular rhythm.     Pulses: Normal pulses.     Heart sounds: Normal heart sounds. No murmur heard.    No friction rub. No gallop.  Pulmonary:     Effort: Pulmonary effort is normal. No respiratory distress.     Breath sounds: Normal breath sounds. No stridor. No wheezing, rhonchi or rales.  Chest:     Chest wall: No tenderness.  Abdominal:     General: Bowel sounds are normal. There is no distension.     Palpations: Abdomen is soft. There is no mass.     Tenderness: There is no abdominal tenderness. There is no rebound.  Musculoskeletal:     Cervical back: Neck supple. No rigidity.     Right lower leg: No edema.     Left lower leg: No edema.  Lymphadenopathy:     Cervical: No cervical adenopathy.   Skin:    General: Skin is warm.     Coloration: Skin is not jaundiced or pale.     Findings: No bruising, erythema, lesion or rash.  Neurological:     General: No focal deficit present.     Mental Status: She is alert and oriented to person, place, and time. Mental status is at baseline.     Cranial Nerves: No cranial nerve  deficit.     Sensory: No sensory deficit.     Motor: No weakness.     Coordination: Coordination normal.     Gait: Gait normal.     Deep Tendon Reflexes: Reflexes normal.  Psychiatric:        Mood and Affect: Mood normal.        Behavior: Behavior normal.        Thought Content: Thought content normal.        Judgment: Judgment normal.           Assessment & Plan:  Atherosclerosis of arteries - Plan: CBC with Differential/Platelet, Comprehensive metabolic panel with GFR, Lipid panel  Essential hypertension - Plan: CBC with Differential/Platelet, Comprehensive metabolic panel with GFR, Lipid panel  Pure hypercholesterolemia - Plan: CBC with Differential/Platelet, Comprehensive metabolic panel with GFR, Lipid panel  Colon cancer screening - Plan: Cologuard Schedule the patient to get a Cologuard.  Check CBC, CMP, lipid panel.  Goal LDL cholesterol 55 if possible.  Increase benazepril to 40 mg a day to get systolic blood pressures under 140 consistently

## 2023-11-30 ENCOUNTER — Ambulatory Visit: Payer: Self-pay | Admitting: Family Medicine

## 2023-11-30 LAB — COMPREHENSIVE METABOLIC PANEL WITH GFR
AG Ratio: 2.1 (calc) (ref 1.0–2.5)
ALT: 18 U/L (ref 6–29)
AST: 27 U/L (ref 10–35)
Albumin: 4.4 g/dL (ref 3.6–5.1)
Alkaline phosphatase (APISO): 74 U/L (ref 37–153)
BUN: 20 mg/dL (ref 7–25)
CO2: 31 mmol/L (ref 20–32)
Calcium: 9.6 mg/dL (ref 8.6–10.4)
Chloride: 104 mmol/L (ref 98–110)
Creat: 0.84 mg/dL (ref 0.60–1.00)
Globulin: 2.1 g/dL (ref 1.9–3.7)
Glucose, Bld: 103 mg/dL — ABNORMAL HIGH (ref 65–99)
Potassium: 4.5 mmol/L (ref 3.5–5.3)
Sodium: 141 mmol/L (ref 135–146)
Total Bilirubin: 0.6 mg/dL (ref 0.2–1.2)
Total Protein: 6.5 g/dL (ref 6.1–8.1)
eGFR: 72 mL/min/1.73m2 (ref >=60)

## 2023-11-30 LAB — CBC WITH DIFFERENTIAL/PLATELET
Absolute Lymphocytes: 1509 {cells}/uL (ref 850–3900)
Absolute Monocytes: 531 {cells}/uL (ref 200–950)
Basophils Absolute: 52 {cells}/uL (ref 0–200)
Basophils Relative: 1.1 %
Eosinophils Absolute: 160 {cells}/uL (ref 15–500)
Eosinophils Relative: 3.4 %
HCT: 42 % (ref 35.0–45.0)
Hemoglobin: 13.6 g/dL (ref 11.7–15.5)
MCH: 29.6 pg (ref 27.0–33.0)
MCHC: 32.4 g/dL (ref 32.0–36.0)
MCV: 91.3 fL (ref 80.0–100.0)
MPV: 10.5 fL (ref 7.5–12.5)
Monocytes Relative: 11.3 %
Neutro Abs: 2449 {cells}/uL (ref 1500–7800)
Neutrophils Relative %: 52.1 %
Platelets: 241 Thousand/uL (ref 140–400)
RBC: 4.6 Million/uL (ref 3.80–5.10)
RDW: 12.5 % (ref 11.0–15.0)
Total Lymphocyte: 32.1 %
WBC: 4.7 Thousand/uL (ref 3.8–10.8)

## 2023-11-30 LAB — LIPID PANEL
Cholesterol: 154 mg/dL (ref <200)
HDL: 70 mg/dL (ref >=50)
LDL Cholesterol (Calc): 65 mg/dL
Non-HDL Cholesterol (Calc): 84 mg/dL (ref <130)
Total CHOL/HDL Ratio: 2.2 (calc) (ref <5.0)
Triglycerides: 111 mg/dL (ref <150)

## 2023-12-11 DIAGNOSIS — Z1211 Encounter for screening for malignant neoplasm of colon: Secondary | ICD-10-CM | POA: Diagnosis not present

## 2023-12-20 ENCOUNTER — Other Ambulatory Visit: Payer: Self-pay

## 2023-12-20 DIAGNOSIS — R195 Other fecal abnormalities: Secondary | ICD-10-CM

## 2023-12-20 LAB — COLOGUARD: COLOGUARD: POSITIVE — AB

## 2023-12-27 ENCOUNTER — Encounter: Payer: Self-pay | Admitting: Internal Medicine

## 2024-01-22 ENCOUNTER — Other Ambulatory Visit: Payer: Self-pay

## 2024-01-22 ENCOUNTER — Other Ambulatory Visit: Payer: Self-pay | Admitting: Family Medicine

## 2024-01-22 ENCOUNTER — Ambulatory Visit: Admitting: Internal Medicine

## 2024-01-22 DIAGNOSIS — I10 Essential (primary) hypertension: Secondary | ICD-10-CM

## 2024-01-22 MED ORDER — ROSUVASTATIN CALCIUM 10 MG PO TABS: 10.0000 mg | ORAL_TABLET | Freq: Every day | ORAL | 0 refills | Status: DC

## 2024-01-23 ENCOUNTER — Ambulatory Visit: Admitting: Gastroenterology

## 2024-01-23 ENCOUNTER — Encounter: Payer: Self-pay | Admitting: Gastroenterology

## 2024-01-23 VITALS — BP 186/77 | HR 78 | Temp 97.6°F | Ht 64.0 in | Wt 162.6 lb

## 2024-01-23 DIAGNOSIS — R030 Elevated blood-pressure reading, without diagnosis of hypertension: Secondary | ICD-10-CM | POA: Diagnosis not present

## 2024-01-23 DIAGNOSIS — R195 Other fecal abnormalities: Secondary | ICD-10-CM | POA: Diagnosis not present

## 2024-01-23 DIAGNOSIS — I10 Essential (primary) hypertension: Secondary | ICD-10-CM

## 2024-01-23 NOTE — Progress Notes (Addendum)
 GI Office Note    Referring Provider: Duanne Butler DASEN, MD Primary Care Physician:  Duanne Butler DASEN, MD Primary Gastroenterologist: Lamar HERO.Rourk, MD  Date:  01/23/2024  ID:  Sarah Williams, DOB 07-Jun-1945, MRN [id number]   Chief Complaint   Chief Complaint  Patient presents with   positive cologuard     Had a positive cologuard    History of Present Illness  Sarah Williams is a 77 y.o. female with a history of osteoporosis, HLD, and HTN presenting today to discuss colonoscopy given recent positive Cologuard.  Colonoscopy November 2015: - Colonic diverticulosis - Repeat colonoscopy in 10 years  Last office visit 06/06/23 with Josette Centers, PA.  Denies abdominal pain, BRBPR, melena, changes in bowel habits, constipation, diarrhea, nausea, vomiting, GERD, or dysphagia.  BP was elevated during office visit but she states she has noticed that her blood pressure does tend to increase when she goes to the doctor but at home her blood pressures range in the systolics 120s to 130s.  Patient stated she went to the gym for an hour that morning and had been feeling fine without any chest pain, shortness of breath, headache or blurry vision.  She was advised to follow-up in November to discuss scheduling colonoscopy.  Labs 11/29/2023 with normal CBC and lipid panel. CMP unremarkable other than mildly elevated glucose at 103.  Cologuard + 12/11/2023.  Today:  Discussed the use of AI scribe software for clinical note transcription with the patient, who gave verbal consent to proceed.  She completed a Cologuard test, which returned positive, causing significant anxiety and depression initially regarding fear of the meaning of this. She has no family history of colon cancer or polyps. No blood in stool, melena, weight loss, changes in bowel habits, constipation, diarrhea, or upper GI symptoms (nausea, vomiting, dysphagia, lack of appetite). She reports that she has never experienced  hemorrhoids. She maintains regular bowel habits.   She experiences anxiety related to her blood pressure readings in the doctor's office, which she attributes to 'white coat syndrome.' Her blood pressure readings at home are typically in the 130s/60s range, but they elevate when she is in the office. A recent reading was 150/74 after exercising. She is currently on 40 mg of medication for blood pressure, increased from 20 mg after a previous high reading in February.  She exercises regularly, working out four days a week, including activities like mowing the yard and gardening and workouts in the gym.  No regular chest pain or shortness of breath but does feel palpitations internally when anxious about medical visits.  Her father experienced similar 'white coat syndrome' with elevated blood pressure readings in medical settings.     Wt Readings from Last 5 Encounters:  01/23/24 162 lb 9.6 oz (73.8 kg)  11/29/23 156 lb (70.8 kg)  06/06/23 164 lb 6.4 oz (74.6 kg)  05/29/23 161 lb (73 kg)  11/28/22 156 lb 12.8 oz (71.1 kg)    Current Outpatient Medications  Medication Sig Dispense Refill   benazepril  (LOTENSIN ) 40 MG tablet Take 1 tablet (40 mg total) by mouth daily. 90 tablet 3   Cholecalciferol (VITAMIN D ) 2000 UNITS tablet Take 2,000 Units by mouth every evening.     Multiple Vitamin (MULTIVITAMIN) tablet Take 1 tablet by mouth every evening. Centrum Silver     rosuvastatin  (CRESTOR ) 10 MG tablet Take 1 tablet (10 mg total) by mouth daily. 90 tablet 0   No current facility-administered medications for this visit.  Past Medical History:  Diagnosis Date   Hyperlipidemia    11/2010   Hypertension    Osteoporosis    PONV (postoperative nausea and vomiting)     Past Surgical History:  Procedure Laterality Date   BREAST CYST ASPIRATION Bilateral    BREAST SURGERY     Benign--Bilateral Lumpectomies--Benign   COLONOSCOPY N/A 03/16/2014   Procedure: COLONOSCOPY;  Surgeon: Lamar CHRISTELLA Hollingshead, MD;  Location: [facility];  Service: Endoscopy;  Laterality: N/A;  2:15 PM   COSMETIC SURGERY  06/16/2006   blepharoplasty    Family History  Problem Relation Age of Onset   Heart disease Mother 22       CAD   Hypertension Father    Kidney disease Father    Stroke Father    Heart disease Paternal Grandfather 42       CAD, MI   BRCA 1/2 Neg Hx    Breast cancer Neg Hx     Allergies as of 01/23/2024   (No Known Allergies)    Social History   Socioeconomic History   Marital status: Married    Spouse name: Vinie   Number of children: 2   Years of education: Not on file   Highest education level: 12th grade  Occupational History   Not on file  Tobacco Use   Smoking status: Never   Smokeless tobacco: Never  Substance and Sexual Activity   Alcohol use: Yes    Alcohol/week: 5.0 standard drinks of alcohol    Types: 5 Glasses of wine per week   Drug use: No   Sexual activity: Yes    Birth control/protection: Post-menopausal  Other Topics Concern   Not on file  Social History Narrative   Entered 02/2014:   Married.   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   Works about 16 hours a week now.   Says she has to stay busy--in summer works in yard, garden.    Always working on a project in the house etc.    5 grandaughters   Social Drivers of Health   Financial Resource Strain: Low Risk  (11/26/2023)   Overall Financial Resource Strain (CARDIA)    Difficulty of Paying Living Expenses: Not hard at all  Food Insecurity: No Food Insecurity (11/26/2023)   Hunger Vital Sign    Worried About Running Out of Food in the Last Year: Never true    Ran Out of Food in the Last Year: Never true  Transportation Needs: No Transportation Needs (11/26/2023)   PRAPARE - Administrator, Civil Service (Medical): No    Lack of Transportation (Non-Medical): No   Physical Activity: Sufficiently Active (11/26/2023)   Exercise Vital Sign    Days of Exercise per Week: 5 days    Minutes of Exercise per Session: 60 min  Stress: No Stress Concern Present (11/26/2023)   [employer] of Occupational Health - Occupational Stress Questionnaire    Feeling of Stress: Not at all  Social Connections: Moderately Isolated (11/26/2023)   Social Connection and Isolation Panel    Frequency of Communication with Friends and Family: More than three times a week    Frequency of Social Gatherings with Friends and Family: Twice a week    Attends Religious Services: Never    Database administrator or Organizations: No    Attends Banker Meetings: Not on file  Marital Status: Married     Review of Systems   Gen: Denies fever, chills, anorexia. Denies fatigue, weakness, weight loss.  CV: Denies chest pain, palpitations, syncope, peripheral edema, and claudication. Resp: Denies dyspnea at rest, cough, wheezing, coughing up blood, and pleurisy. GI: See HPI Derm: Denies rash, itching, dry skin Psych: Denies depression, anxiety, memory loss, confusion. No homicidal or suicidal ideation.  Heme: Denies bruising, bleeding, and enlarged lymph nodes.  Physical Exam   BP (!) 186/77 (BP Location: Right Arm, Patient Position: Sitting, Cuff Size: Normal)   Pulse 78   Temp 97.6 F (36.4 C) (Temporal)   Ht 5' 4 (1.626 m)   Wt 162 lb 9.6 oz (73.8 kg)   BMI 27.91 kg/m  1st BP reading by CMA: 204/79 in left arm sitting.   Manual reading by myself in right arm sitting: 172/88  General:   Alert and oriented. No distress noted. Pleasant and cooperative.  Head:  Normocephalic and atraumatic. Eyes:  Conjuctiva clear without scleral icterus. Mouth:  Oral mucosa pink and moist. Good dentition. No lesions. Lungs:  Clear to auscultation bilaterally. No wheezes, rales, or rhonchi. No distress.  Heart:  S1, S2 present without murmurs appreciated.  Abdomen:  +BS,  soft, non-tender and non-distended. No rebound or guarding. No HSM or masses noted. Rectal: deferred Msk:  Symmetrical without gross deformities. Normal posture. Extremities:  Without edema. Neurologic:  Alert and  oriented x4 Psych:  Alert and cooperative. Normal mood and affect.  Assessment & Plan  Sarah Williams is a 78 y.o. female presenting today to discuss screening colonoscopy given recent positive Cologuard     Positive Cologuard test, colonoscopy planned The positive Cologuard test has caused significant anxiety. There is no family history of colon cancer or polyps, and no alarm symptoms. We discussed that false positives and false negatives do exist. She understands the need for colonoscopy for further evaluation.  She is aware of potential out of pocket cost if polyps found and removed.  - Schedule colonoscopy  Proceed with colonoscopy with propofol by Dr. Shaaron in near future: the risks, benefits, and alternatives have been discussed with the patient in detail. The patient states understanding and desires to proceed. ASA 2  White coat hypertension Blood pressure readings are elevated in the office but typically normal at home, indicating likely white coat hypertension. She experiences anxiety in medical settings, which likely contributes to elevated readings. Generally, at her PCP office she reports BP is fairly normal. Home readings are generally in the 130s/60s range, with occasional elevations after physical activity. BP medication increased in August.  - Check blood pressure at home after returning from the appointment. - Report if blood pressure remains elevated. - Monitor for symptoms such as chest pain, dizziness, or blurry vision and report if they occur or seek ED treatment for severe treatments.     Follow up   Follow up as needed    Charmaine Melia, MSN, FNP-BC, AGACNP-BC [facility]

## 2024-01-23 NOTE — Patient Instructions (Addendum)
 We will get you scheduled for colonoscopy in the near future with Dr. Shaaron.   After resting for an hour or so at home please recheck your BP and let us  know if it remains elevated and also discuss with Dr. Duanne. Please call PCP or go to the ED if you develop chest pain, shortness of breath, severe headache, or blurry vision.  It was a pleasure to meet today!  I hope you have a great rest of your day!  Follow-up will be determined pending your colonoscopy findings.  It was a pleasure to see you today. I want to create trusting relationships with patients. If you receive a survey regarding your visit,  I greatly appreciate you taking time to fill this out on paper or through your MyChart. I value your feedback.  Charmaine Melia, MSN, FNP-BC, AGACNP-BC [facility]

## 2024-01-29 ENCOUNTER — Telehealth: Payer: Self-pay | Admitting: *Deleted

## 2024-01-29 NOTE — Telephone Encounter (Signed)
 LMOVM to call back to schedule colonoscopy with Dr. Shaaron, asa 2

## 2024-01-31 NOTE — Telephone Encounter (Signed)
 LMOVM to call back. Letter mailed. ?

## 2024-02-04 MED ORDER — PEG 3350-KCL-NA BICARB-NACL 420 G PO SOLR: 4000.0000 mL | Freq: Once | ORAL | 0 refills | Status: AC

## 2024-02-04 NOTE — Addendum Note (Signed)
 Addended by: JEANELL GRAEME RAMAN on: 02/04/2024 09:45 AM   Modules accepted: Orders

## 2024-02-04 NOTE — Telephone Encounter (Signed)
 Pt called in. Scheduled for 11/14. Aware will mail instructions to her and rx for prep to be sent to pharmacy.

## 2024-02-20 ENCOUNTER — Ambulatory Visit: Payer: Self-pay

## 2024-02-20 NOTE — Telephone Encounter (Signed)
 FYI Only or Action Required?: FYI only for provider: appointment scheduled on 02/22/24.  Patient was last seen in primary care on 11/29/2023 by Duanne Butler DASEN, MD.  Called Nurse Triage reporting Hypertension.  Symptoms began a week ago.  Interventions attempted: Prescription medications: benzapril.  Symptoms are: gradually worsening.  Triage Disposition: See PCP When Office is Open (Within 3 Days)  Patient/caregiver understands and will follow disposition?: Yes    Copied from CRM [id number]. Topic: Clinical - Red Word Triage >> Feb 20, 2024 12:00 PM Wess RAMAN wrote: Red Word that prompted transfer to Nurse Triage: BP: 150-170 for top number. Stays high no matter what. benazepril  (LOTENSIN ) 40 MG tablet was recently increased.  Dull pain on side of face a few times Reason for Disposition  Systolic BP >= 160 OR Diastolic >= 100  Answer Assessment - Initial Assessment Questions 1. BLOOD PRESSURE: What is your blood pressure? Did you take at least two measurements 5 minutes apart?     152/67, 167/72, 160/80 2. ONSET: When did you take your blood pressure?     daily 3. HOW: How did you take your blood pressure? (e.g., automatic home BP monitor, visiting nurse)     home 4. HISTORY: Do you have a history of high blood pressure?     yes 5. MEDICINES: Are you taking any medicines for blood pressure? Have you missed any doses recently?     Benazepril  40mg   6. OTHER SYMPTOMS: Do you have any symptoms? (e.g., blurred vision, chest pain, difficulty breathing, headache, weakness)     Dull headache intermittently no headache today.  7. PREGNANCY: Is there any chance you are pregnant? When was your last menstrual period?  Protocols used: Blood Pressure - High-A-AH

## 2024-02-22 ENCOUNTER — Ambulatory Visit (INDEPENDENT_AMBULATORY_CARE_PROVIDER_SITE_OTHER): Admitting: Family Medicine

## 2024-02-22 ENCOUNTER — Encounter: Payer: Self-pay | Admitting: Family Medicine

## 2024-02-22 VITALS — BP 204/89 | HR 65 | Temp 97.6°F | Ht 64.0 in | Wt 159.8 lb

## 2024-02-22 DIAGNOSIS — I10 Essential (primary) hypertension: Secondary | ICD-10-CM

## 2024-02-22 MED ORDER — AMLODIPINE BESYLATE 10 MG PO TABS: 10.0000 mg | ORAL_TABLET | Freq: Every day | ORAL | 3 refills | Status: AC

## 2024-02-22 NOTE — Progress Notes (Signed)
 Subjective:    Patient ID: Sarah Williams, female    DOB: 1945-07-28, 78 y.o.   MRN: [id number]  Patient's systolic blood pressure has typically been around 140-150.  At her last office visit, I increased benazepril  to 40 mg a day.  She has been monitoring her blood pressure however she recently had a positive Cologuard test.  She has a colonoscopy scheduled for next Friday.  She is extremely anxious about this.  She has been checking her blood pressure and her systolic blood pressure has been climbing substantially and is now 160-180 at home.  The more she checks her blood pressure the more upset and anxious she becomes.  Today she appears very anxious and upset.  The more we check her blood pressure, the systolic number continues to climb.  I feel that this is likely due to anxiety and agitation.  I explained to the patient I feel her baseline systolic pressure is roughly 160.  However I fear the anxiety over the Cologuard as well as constant monitoring of her blood pressure has induced an adrenaline surge which is raising her blood pressure. Past Medical History:  Diagnosis Date   Hyperlipidemia    11/2010   Hypertension    Osteoporosis    PONV (postoperative nausea and vomiting)    Past Surgical History:  Procedure Laterality Date   BREAST CYST ASPIRATION Bilateral    BREAST SURGERY     Benign--Bilateral Lumpectomies--Benign   COLONOSCOPY N/A 03/16/2014   Procedure: COLONOSCOPY;  Surgeon: Lamar CHRISTELLA Hollingshead, MD;  Location: [facility];  Service: Endoscopy;  Laterality: N/A;  2:15 PM   COSMETIC SURGERY  06/16/2006   blepharoplasty   Current Outpatient Medications on File Prior to Visit  Medication Sig Dispense Refill   benazepril  (LOTENSIN ) 40 MG tablet Take 1 tablet (40 mg total) by mouth daily. 90 tablet 3   Cholecalciferol (VITAMIN D ) 2000 UNITS tablet Take 2,000 Units by mouth every evening.     Multiple Vitamin (MULTIVITAMIN) tablet Take 1 tablet by mouth every evening. Centrum  Silver     rosuvastatin  (CRESTOR ) 10 MG tablet Take 1 tablet (10 mg total) by mouth daily. 90 tablet 0   No current facility-administered medications on file prior to visit.   No Known Allergies Social History   Socioeconomic History   Marital status: Married    Spouse name: Vinie   Number of children: 2   Years of education: Not on file   Highest education level: 12th grade  Occupational History   Not on file  Tobacco Use   Smoking status: Never   Smokeless tobacco: Never  Substance and Sexual Activity   Alcohol use: Yes    Alcohol/week: 5.0 standard drinks of alcohol    Types: 5 Glasses of wine per week   Drug use: No   Sexual activity: Yes    Birth control/protection: Post-menopausal  Other Topics Concern   Not on file  Social History Narrative   Entered 02/2014:   Married.   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   Works about 16 hours a week now.   Says she has to stay busy--in summer works in yard, garden.    Always working on a project in the house etc.    5 grandaughters   Social Drivers of Health   Financial Resource Strain: Low Risk  (11/26/2023)   Overall Financial Resource  Strain (CARDIA)    Difficulty of Paying Living Expenses: Not hard at all  Food Insecurity: No Food Insecurity (11/26/2023)   Hunger Vital Sign    Worried About Running Out of Food in the Last Year: Never true    Ran Out of Food in the Last Year: Never true  Transportation Needs: No Transportation Needs (11/26/2023)   PRAPARE - Administrator, Civil Service (Medical): No    Lack of Transportation (Non-Medical): No  Physical Activity: Sufficiently Active (11/26/2023)   Exercise Vital Sign    Days of Exercise per Week: 5 days    Minutes of Exercise per Session: 60 min  Stress: No Stress Concern Present (11/26/2023)   [employer] of Occupational Health - Occupational  Stress Questionnaire    Feeling of Stress: Not at all  Social Connections: Moderately Isolated (11/26/2023)   Social Connection and Isolation Panel    Frequency of Communication with Friends and Family: More than three times a week    Frequency of Social Gatherings with Friends and Family: Twice a week    Attends Religious Services: Never    Database Administrator or Organizations: No    Attends Engineer, Structural: Not on file    Marital Status: Married  Catering Manager Violence: Not At Risk (04/19/2022)   Humiliation, Afraid, Rape, and Kick questionnaire    Fear of Current or Ex-Partner: No    Emotionally Abused: No    Physically Abused: No    Sexually Abused: No      Review of Systems     Objective:   Physical Exam Vitals reviewed.  Constitutional:      General: She is not in acute distress.    Appearance: Normal appearance. She is normal weight. She is not ill-appearing.  HENT:     Head: Normocephalic and atraumatic.     Right Ear: Tympanic membrane, ear canal and external ear normal. There is no impacted cerumen.     Left Ear: Tympanic membrane, ear canal and external ear normal. There is no impacted cerumen.     Nose: Nose normal. No congestion or rhinorrhea.     Mouth/Throat:     Mouth: Mucous membranes are moist.     Pharynx: No oropharyngeal exudate or posterior oropharyngeal erythema.  Eyes:     Extraocular Movements: Extraocular movements intact.     Conjunctiva/sclera: Conjunctivae normal.     Pupils: Pupils are equal, round, and reactive to light.  Neck:     Vascular: Carotid bruit present.  Cardiovascular:     Rate and Rhythm: Normal rate and regular rhythm.     Pulses: Normal pulses.     Heart sounds: Normal heart sounds. No murmur heard.    No friction rub. No gallop.  Pulmonary:     Effort: Pulmonary effort is normal. No respiratory distress.     Breath sounds: Normal breath sounds. No stridor. No wheezing, rhonchi or rales.  Chest:      Chest wall: No tenderness.  Abdominal:     General: Bowel sounds are normal. There is no distension.     Palpations: Abdomen is soft. There is no mass.     Tenderness: There is no abdominal tenderness. There is no rebound.  Musculoskeletal:     Cervical back: Neck supple. No rigidity.     Right lower leg: No edema.     Left lower leg: No edema.  Lymphadenopathy:     Cervical: No cervical adenopathy.  Skin:  General: Skin is warm.     Coloration: Skin is not jaundiced or pale.     Findings: No bruising, erythema, lesion or rash.  Neurological:     General: No focal deficit present.     Mental Status: She is alert and oriented to person, place, and time. Mental status is at baseline.     Cranial Nerves: No cranial nerve deficit.     Sensory: No sensory deficit.     Motor: No weakness.     Coordination: Coordination normal.     Gait: Gait normal.     Deep Tendon Reflexes: Reflexes normal.  Psychiatric:        Mood and Affect: Mood normal.        Behavior: Behavior normal.        Thought Content: Thought content normal.        Judgment: Judgment normal.           Assessment & Plan:  Essential hypertension Blood pressure is too high but I do not believe today's number is an accurate representation of her blood pressure.  She seems very upset and anxious.  I feel that this is likely artificially raising the blood pressure further.  I feel that a more accurate assessment of her systolic blood pressure is around 160.  Add amlodipine  10 mg a day to benazepril .  Try to encourage the patient to relax and try not to worry/work herself up over her blood pressure as this only makes things worse.  Recheck blood pressure on Monday and Tuesday.  If very high at that point consider adding hydrochlorothiazide as well

## 2024-02-26 ENCOUNTER — Encounter: Payer: Self-pay | Admitting: Family Medicine

## 2024-02-29 ENCOUNTER — Encounter (HOSPITAL_COMMUNITY)
Admission: RE | Disposition: A | Discharge status: Home / Self Care | Payer: Self-pay | Source: Home / Self Care | Attending: Internal Medicine

## 2024-02-29 ENCOUNTER — Ambulatory Visit (HOSPITAL_COMMUNITY): Admitting: Anesthesiology

## 2024-02-29 ENCOUNTER — Ambulatory Visit (HOSPITAL_COMMUNITY)
Admission: RE | Admit: 2024-02-29 | Discharge: 2024-02-29 | Disposition: A | Discharge status: Home / Self Care | Attending: Internal Medicine | Admitting: Internal Medicine

## 2024-02-29 ENCOUNTER — Encounter (HOSPITAL_COMMUNITY): Payer: Self-pay | Admitting: Internal Medicine

## 2024-02-29 ENCOUNTER — Other Ambulatory Visit: Payer: Self-pay

## 2024-02-29 DIAGNOSIS — D122 Benign neoplasm of ascending colon: Secondary | ICD-10-CM | POA: Insufficient documentation

## 2024-02-29 DIAGNOSIS — K5730 Diverticulosis of large intestine without perforation or abscess without bleeding: Secondary | ICD-10-CM | POA: Diagnosis not present

## 2024-02-29 DIAGNOSIS — K635 Polyp of colon: Secondary | ICD-10-CM | POA: Diagnosis not present

## 2024-02-29 DIAGNOSIS — Z1211 Encounter for screening for malignant neoplasm of colon: Secondary | ICD-10-CM

## 2024-02-29 DIAGNOSIS — R195 Other fecal abnormalities: Secondary | ICD-10-CM | POA: Insufficient documentation

## 2024-02-29 DIAGNOSIS — M810 Age-related osteoporosis without current pathological fracture: Secondary | ICD-10-CM | POA: Diagnosis not present

## 2024-02-29 DIAGNOSIS — I10 Essential (primary) hypertension: Secondary | ICD-10-CM | POA: Diagnosis not present

## 2024-02-29 HISTORY — PX: COLONOSCOPY: SHX5424

## 2024-02-29 SURGERY — COLONOSCOPY
Anesthesia: Monitor Anesthesia Care

## 2024-02-29 MED ORDER — LIDOCAINE HCL (CARDIAC) PF 100 MG/5ML IV SOSY
PREFILLED_SYRINGE | INTRAVENOUS | Status: DC | PRN
  Administered 2024-02-29: 50 mg via INTRAVENOUS

## 2024-02-29 MED ORDER — PROPOFOL 10 MG/ML IV BOLUS
INTRAVENOUS | Status: DC | PRN
  Administered 2024-02-29: 175 ug/kg/min via INTRAVENOUS
  Administered 2024-02-29: 100 mg via INTRAVENOUS

## 2024-02-29 MED ORDER — LACTATED RINGERS IV SOLN: INTRAVENOUS | Status: DC

## 2024-02-29 NOTE — Anesthesia Postprocedure Evaluation (Signed)
 Anesthesia Post Note  Patient: Sarah Williams  Procedure(s) Performed: COLONOSCOPY  Patient location during evaluation: Phase II Anesthesia Type: MAC Level of consciousness: awake Pain management: pain level controlled Vital Signs Assessment: post-procedure vital signs reviewed and stable Respiratory status: spontaneous breathing and respiratory function stable Cardiovascular status: blood pressure returned to baseline and stable Postop Assessment: no headache and no apparent nausea or vomiting Anesthetic complications: no Comments: Late entry   No notable events documented.   Last Vitals:  Vitals:   02/29/24 0835 02/29/24 1025  BP:  (!) 106/48  Pulse: 84 73  Resp: 15 15  Temp: 36.4 C 36.4 C  SpO2: 98% 99%    Last Pain:  Vitals:   02/29/24 1025  TempSrc: Oral  PainSc: 0-No pain                 Yvonna JINNY Bosworth

## 2024-02-29 NOTE — H&P (Signed)
 @LOGO @   Gastroenterology Progress Note    Primary Care Physician:  Duanne Butler DASEN, MD Primary Gastroenterologist:  Dr. Shaaron  Pre-Procedure History & Physical: HPI:  Sarah Williams is a 78 y.o. female here for   For diagnostic colonoscopy.  Positive Cologuard.  No bowel symptoms.  2015 colonoscopy yielded only diverticulosis.  Past Medical History:  Diagnosis Date   Hyperlipidemia    11/2010   Hypertension    Osteoporosis     Past Surgical History:  Procedure Laterality Date   BREAST CYST ASPIRATION Bilateral    BREAST SURGERY     Benign--Bilateral Lumpectomies--Benign   COLONOSCOPY N/A 03/16/2014   Procedure: COLONOSCOPY;  Surgeon: Lamar CHRISTELLA Shaaron, MD;  Location: [facility];  Service: Endoscopy;  Laterality: N/A;  2:15 PM   COSMETIC SURGERY  06/16/2006   blepharoplasty    Prior to Admission medications   Medication Sig Start Date End Date Taking? Authorizing Provider  amLODipine  (NORVASC ) 10 MG tablet Take 1 tablet (10 mg total) by mouth daily. 02/22/24  Yes Duanne Butler DASEN, MD  benazepril  (LOTENSIN ) 40 MG tablet Take 1 tablet (40 mg total) by mouth daily. 11/29/23  Yes Duanne Butler DASEN, MD  Cholecalciferol (VITAMIN D ) 2000 UNITS tablet Take 2,000 Units by mouth every evening.   Yes [provider]  Multiple Vitamin (MULTIVITAMIN) tablet Take 1 tablet by mouth every evening. Centrum Silver   Yes [provider]  rosuvastatin  (CRESTOR ) 10 MG tablet Take 1 tablet (10 mg total) by mouth daily. 01/22/24  Yes Duanne Butler DASEN, MD    Allergies as of 02/04/2024   (No Known Allergies)    Family History  Problem Relation Age of Onset   Heart disease Mother 43       CAD   Hypertension Father    Kidney disease Father    Stroke Father    Heart disease Paternal Grandfather 67       CAD, MI   BRCA 1/2 Neg Hx    Breast cancer Neg Hx     Social History   Socioeconomic History   Marital status: Married    Spouse name: Vinie   Number of  children: 2   Years of education: Not on file   Highest education level: 12th grade  Occupational History   Not on file  Tobacco Use   Smoking status: Never   Smokeless tobacco: Never  Vaping Use   Vaping status: Never Used  Substance and Sexual Activity   Alcohol use: Yes    Alcohol/week: 5.0 standard drinks of alcohol    Types: 5 Glasses of wine per week   Drug use: No   Sexual activity: Yes    Birth control/protection: Post-menopausal  Other Topics Concern   Not on file  Social History Narrative   Entered 02/2014:   Married.   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   Works about 16 hours a week now.   Says she has to stay busy--in summer works in yard, garden.    Always working on a project in the house etc.    5 grandaughters   Social Drivers of Health   Financial Resource Strain: Low Risk  (11/26/2023)   Overall Financial Resource Strain (CARDIA)    Difficulty of Paying Living Expenses: Not hard at all  Food Insecurity: No Food Insecurity (11/26/2023)   Hunger Vital Sign  Worried About Programme Researcher, Broadcasting/film/video in the Last Year: Never true    Ran Out of Food in the Last Year: Never true  Transportation Needs: No Transportation Needs (11/26/2023)   PRAPARE - Administrator, Civil Service (Medical): No    Lack of Transportation (Non-Medical): No  Physical Activity: Sufficiently Active (11/26/2023)   Exercise Vital Sign    Days of Exercise per Week: 5 days    Minutes of Exercise per Session: 60 min  Stress: No Stress Concern Present (11/26/2023)   [employer] of Occupational Health - Occupational Stress Questionnaire    Feeling of Stress: Not at all  Social Connections: Moderately Isolated (11/26/2023)   Social Connection and Isolation Panel    Frequency of Communication with Friends and Family: More than three times a week    Frequency of Social  Gatherings with Friends and Family: Twice a week    Attends Religious Services: Never    Database Administrator or Organizations: No    Attends Engineer, Structural: Not on file    Marital Status: Married  Catering Manager Violence: Not At Risk (04/19/2022)   Humiliation, Afraid, Rape, and Kick questionnaire    Fear of Current or Ex-Partner: No    Emotionally Abused: No    Physically Abused: No    Sexually Abused: No    Review of Systems   See HPI, otherwise negative ROS  Physical Exam: Pulse 84   Temp 97.6 F (36.4 C) (Oral)   Resp 15   SpO2 98%  General:   Alert,  Well-developed, well-nourished, pleasant and cooperative in NAD Neck:  Supple; no masses or thyromegaly. No significant cervical adenopathy. Lungs:  Clear throughout to auscultation.   No wheezes, crackles, or rhonchi. No acute distress. Heart:  Regular rate and rhythm; no murmurs, clicks, rubs,  or gallops. Abdomen: Non-distended, normal bowel sounds.  Soft and nontender without appreciable mass or hepatosplenomegaly.    Impression/Plan:    78 year old lady with a positive Cologuard.  She is here for diagnostic colonoscopy..  The risks, benefits, limitations, alternatives and imponderables have been reviewed with the patient. Questions have been answered. All parties are agreeable.       Notice: This dictation was prepared with Dragon dictation along with smaller phrase technology. Any transcriptional errors that result from this process are unintentional and may not be corrected upon review.

## 2024-02-29 NOTE — Op Note (Signed)
 [facility] Patient Name: Sarah Williams Procedure Date: 02/29/2024 9:35 AM MRN: [id number] Date of Birth: 10-24-1945 Attending MD: Lamar Ozell Hollingshead , MD, [id number] CSN: [id number] Age: 78 Admit Type: Outpatient Procedure:                Colonoscopy Indications:              Positive Cologuard test Providers:                Lamar Ozell Hollingshead, MD, Crystal Page, Gordy Bruckner Tech, Technician Referring MD:              Medicines:                Propofol per Anesthesia Complications:            No immediate complications. Estimated Blood Loss:     Estimated blood loss was minimal. Procedure:                Pre-Anesthesia Assessment:                           - Prior to the procedure, a History and Physical                            was performed, and patient medications and                            allergies were reviewed. The patient's tolerance of                            previous anesthesia was also reviewed. The risks                            and benefits of the procedure and the sedation                            options and risks were discussed with the patient.                            All questions were answered, and informed consent                            was obtained. Prior Anticoagulants: The patient has                            taken no anticoagulant or antiplatelet agents. ASA                            Grade Assessment: III - A patient with severe                            systemic disease. After reviewing the risks and  benefits, the patient was deemed in satisfactory                            condition to undergo the procedure.                           After obtaining informed consent, the colonoscope                            was passed under direct vision. Throughout the                            procedure, the patient's blood pressure, pulse, and                            oxygen  saturations were monitored continuously. The                            [id number] ([id number]) Colon was introduced through                            the anus and advanced to the the cecum, identified                            by appendiceal orifice and ileocecal valve. The                            colonoscopy was performed without difficulty. The                            patient tolerated the procedure well. The quality                            of the bowel preparation was adequate. The                            ileocecal valve, appendiceal orifice, and rectum                            were photographed. Scope In: 10:04:32 AM Scope Out: 10:22:40 AM Scope Withdrawal Time: 0 hours 11 minutes 49 seconds  Total Procedure Duration: 0 hours 18 minutes 8 seconds  Findings:      The perianal and digital rectal examinations were normal.      Scattered large-mouthed and small-mouthed diverticula were found in the       sigmoid colon and descending colon.      Two sessile polyps were found in the sigmoid colon and ascending colon.       The polyps were 2 to 3 mm in size. These polyps were removed with a cold       snare. Resection and retrieval were complete. Estimated blood loss was       minimal.      The exam was otherwise without abnormality on direct and retroflexion       views. Impression:               -  Diverticulosis in the sigmoid colon and in the                            descending colon.                           - Two 2 to 3 mm polyps in the sigmoid colon and in                            the ascending colon, removed with a cold snare.                            Resected and retrieved.                           - The examination was otherwise normal on direct                            and retroflexion views. Moderate Sedation:      Moderate (conscious) sedation was personally administered by an       anesthesia professional. The following parameters were monitored:  oxygen       saturation, heart rate, blood pressure, respiratory rate, EKG, adequacy       of pulmonary ventilation, and response to care. Recommendation:           - Patient has a contact number available for                            emergencies. The signs and symptoms of potential                            delayed complications were discussed with the                            patient. Return to normal activities tomorrow.                            Written discharge instructions were provided to the                            patient.                           - Advance diet as tolerated.                           - Continue present medications.                           - Repeat colonoscopy date to be determined after                            pending pathology results are reviewed for                            surveillance.                           -  Return to GI office (date not yet determined). Procedure Code(s):        --- Professional ---                           [id number], Colonoscopy, flexible; with removal of                            tumor(s), polyp(s), or other lesion(s) by snare                            technique Diagnosis Code(s):        --- Professional ---                           D12.5, Benign neoplasm of sigmoid colon                           D12.2, Benign neoplasm of ascending colon                           R19.5, Other fecal abnormalities                           K57.30, Diverticulosis of large intestine without                            perforation or abscess without bleeding CPT copyright 2022 American Medical Association. All rights reserved. The codes documented in this report are preliminary and upon coder review may  be revised to meet current compliance requirements. Lamar HERO. Mercedes Valeriano, MD Lamar Ozell Hollingshead, MD 02/29/2024 10:31:29 AM This report has been signed electronically. Number of Addenda: 0

## 2024-02-29 NOTE — Discharge Instructions (Signed)
  Colonoscopy Discharge Instructions  Read the instructions outlined below and refer to this sheet in the next few weeks. These discharge instructions provide you with general information on caring for yourself after you leave the hospital. Your doctor may also give you specific instructions. While your treatment has been planned according to the most current medical practices available, unavoidable complications occasionally occur. If you have any problems or questions after discharge, call Dr. Shaaron at [phone]. ACTIVITY You may resume your regular activity, but move at a slower pace for the next 24 hours.  Take frequent rest periods for the next 24 hours.  Walking will help get rid of the air and reduce the bloated feeling in your belly (abdomen).  No driving for 24 hours (because of the medicine (anesthesia) used during the test).   Do not sign any important legal documents or operate any machinery for 24 hours (because of the anesthesia used during the test).  NUTRITION Drink plenty of fluids.  You may resume your normal diet as instructed by your doctor.  Begin with a light meal and progress to your normal diet. Heavy or fried foods are harder to digest and may make you feel sick to your stomach (nauseated).  Avoid alcoholic beverages for 24 hours or as instructed.  MEDICATIONS You may resume your normal medications unless your doctor tells you otherwise.  WHAT YOU CAN EXPECT TODAY Some feelings of bloating in the abdomen.  Passage of more gas than usual.  Spotting of blood in your stool or on the toilet paper.  IF YOU HAD POLYPS REMOVED DURING THE COLONOSCOPY: No aspirin products for 7 days or as instructed.  No alcohol for 7 days or as instructed.  Eat a soft diet for the next 24 hours.  FINDING OUT THE RESULTS OF YOUR TEST Not all test results are available during your visit. If your test results are not back during the visit, make an appointment with your caregiver to find out the  results. Do not assume everything is normal if you have not heard from your caregiver or the medical facility. It is important for you to follow up on all of your test results.  SEEK IMMEDIATE MEDICAL ATTENTION IF: You have more than a spotting of blood in your stool.  Your belly is swollen (abdominal distention).  You are nauseated or vomiting.  You have a temperature over 101.  You have abdominal pain or discomfort that is severe or gets worse throughout the day.       2 small polyps found and removed   you also have diverticulosis   further recommendations to follow pending review of pathology report

## 2024-02-29 NOTE — Anesthesia Preprocedure Evaluation (Signed)
 Anesthesia Evaluation  Patient identified by MRN, date of birth, ID band Patient awake    Reviewed: Allergy & Precautions, H&P , NPO status , Patient's Chart, lab work & pertinent test results, reviewed documented beta blocker date and time   Airway Mallampati: II  TM Distance: >3 FB Neck ROM: full    Dental no notable dental hx.    Pulmonary neg pulmonary ROS   Pulmonary exam normal breath sounds clear to auscultation       Cardiovascular Exercise Tolerance: Good hypertension, negative cardio ROS  Rhythm:regular Rate:Normal     Neuro/Psych negative neurological ROS  negative psych ROS   GI/Hepatic negative GI ROS, Neg liver ROS,,,  Endo/Other  negative endocrine ROS    Renal/GU negative Renal ROS  negative genitourinary   Musculoskeletal   Abdominal   Peds  Hematology negative hematology ROS (+)   Anesthesia Other Findings   Reproductive/Obstetrics negative OB ROS                              Anesthesia Physical Anesthesia Plan  ASA: 2  Anesthesia Plan: MAC   Post-op Pain Management:    Induction:   PONV Risk Score and Plan: Propofol  infusion  Airway Management Planned:   Additional Equipment:   Intra-op Plan:   Post-operative Plan:   Informed Consent: I have reviewed the patients History and Physical, chart, labs and discussed the procedure including the risks, benefits and alternatives for the proposed anesthesia with the patient or authorized representative who has indicated his/her understanding and acceptance.     Dental Advisory Given  Plan Discussed with: CRNA  Anesthesia Plan Comments:         Anesthesia Quick Evaluation

## 2024-02-29 NOTE — Transfer of Care (Signed)
 Immediate Anesthesia Transfer of Care Note  Patient: Sarah Williams  Procedure(s) Performed: COLONOSCOPY  Patient Location: Endoscopy Unit  Anesthesia Type:General  Level of Consciousness: drowsy, patient cooperative, and responds to stimulation  Airway & Oxygen Therapy: Patient Spontanous Breathing  Post-op Assessment: Report given to RN and Post -op Vital signs reviewed and stable  Post vital signs: Reviewed and stable  Last Vitals:  Vitals Value Taken Time  BP 106/48 02/29/24 10:25  Temp 36.4 C 02/29/24 10:25  Pulse 73 02/29/24 10:25  Resp 15 02/29/24 10:25  SpO2 99 % 02/29/24 10:25    Last Pain:  Vitals:   02/29/24 1025  TempSrc: Oral  PainSc: 0-No pain      Patients Stated Pain Goal: 5 (02/29/24 0835)  Complications: No notable events documented.

## 2024-03-03 ENCOUNTER — Encounter (HOSPITAL_COMMUNITY): Payer: Self-pay | Admitting: Internal Medicine

## 2024-03-03 LAB — SURGICAL PATHOLOGY

## 2024-03-04 ENCOUNTER — Ambulatory Visit: Payer: Self-pay | Admitting: Internal Medicine

## 2024-04-19 ENCOUNTER — Other Ambulatory Visit: Payer: Self-pay | Admitting: Family Medicine

## 2024-04-24 NOTE — Progress Notes (Signed)
 Sarah Williams                                          MRN: [id number]   04/24/2024   The [facility] Quality Team Specialist reviewed this patient medical record for the purposes of chart review for care gap closure. The following were reviewed: chart review for care gap closure-controlling blood pressure.    [facility] Quality Team

## 2024-06-09 ENCOUNTER — Other Ambulatory Visit

## 2024-06-12 ENCOUNTER — Ambulatory Visit

## 2024-06-12 ENCOUNTER — Encounter: Admitting: Family Medicine
